# Patient Record
Sex: Female | Born: 1950 | Race: White | Hispanic: No | Marital: Married | Smoking: Never smoker
Health system: Southern US, Community
[De-identification: ages and names within clinical notes are randomized; demographics above are authoritative.]

## PROBLEM LIST (undated history)

## (undated) DIAGNOSIS — E785 Hyperlipidemia, unspecified: Secondary | ICD-10-CM

## (undated) DIAGNOSIS — E079 Disorder of thyroid, unspecified: Secondary | ICD-10-CM

## (undated) DIAGNOSIS — I10 Essential (primary) hypertension: Secondary | ICD-10-CM

## (undated) DIAGNOSIS — C50919 Malignant neoplasm of unspecified site of unspecified female breast: Secondary | ICD-10-CM

## (undated) HISTORY — DX: Disorder of thyroid, unspecified: E07.9

## (undated) HISTORY — DX: Essential (primary) hypertension: I10

## (undated) HISTORY — DX: Hyperlipidemia, unspecified: E78.5

## (undated) HISTORY — DX: Malignant neoplasm of unspecified site of unspecified female breast: C50.919

## (undated) HISTORY — PX: TONSILLECTOMY: SUR1361

## (undated) HISTORY — PX: BREAST LUMPECTOMY: SHX2

## (undated) HISTORY — PX: OTHER SURGICAL HISTORY: SHX169

---

## 2007-03-27 DIAGNOSIS — E039 Hypothyroidism, unspecified: Secondary | ICD-10-CM

## 2007-03-27 HISTORY — DX: Hypothyroidism, unspecified: E03.9

## 2016-05-03 DIAGNOSIS — W000XXA Fall on same level due to ice and snow, initial encounter: Secondary | ICD-10-CM | POA: Diagnosis not present

## 2016-05-03 DIAGNOSIS — S20221A Contusion of right back wall of thorax, initial encounter: Secondary | ICD-10-CM | POA: Diagnosis not present

## 2016-07-10 DIAGNOSIS — E782 Mixed hyperlipidemia: Secondary | ICD-10-CM | POA: Diagnosis not present

## 2016-07-10 DIAGNOSIS — Z7721 Contact with and (suspected) exposure to potentially hazardous body fluids: Secondary | ICD-10-CM | POA: Diagnosis not present

## 2016-07-10 DIAGNOSIS — E039 Hypothyroidism, unspecified: Secondary | ICD-10-CM | POA: Diagnosis not present

## 2016-07-17 DIAGNOSIS — E039 Hypothyroidism, unspecified: Secondary | ICD-10-CM | POA: Diagnosis not present

## 2016-07-17 DIAGNOSIS — E782 Mixed hyperlipidemia: Secondary | ICD-10-CM | POA: Diagnosis not present

## 2016-12-03 DIAGNOSIS — M170 Bilateral primary osteoarthritis of knee: Secondary | ICD-10-CM | POA: Diagnosis not present

## 2016-12-03 DIAGNOSIS — M25562 Pain in left knee: Secondary | ICD-10-CM | POA: Diagnosis not present

## 2017-02-11 DIAGNOSIS — M25562 Pain in left knee: Secondary | ICD-10-CM | POA: Diagnosis not present

## 2017-02-18 DIAGNOSIS — M25562 Pain in left knee: Secondary | ICD-10-CM | POA: Diagnosis not present

## 2017-02-22 DIAGNOSIS — M25562 Pain in left knee: Secondary | ICD-10-CM | POA: Diagnosis not present

## 2017-02-25 DIAGNOSIS — M25562 Pain in left knee: Secondary | ICD-10-CM | POA: Diagnosis not present

## 2017-02-28 DIAGNOSIS — M25562 Pain in left knee: Secondary | ICD-10-CM | POA: Diagnosis not present

## 2017-03-06 DIAGNOSIS — M25562 Pain in left knee: Secondary | ICD-10-CM | POA: Diagnosis not present

## 2017-03-08 DIAGNOSIS — M25562 Pain in left knee: Secondary | ICD-10-CM | POA: Diagnosis not present

## 2017-03-12 DIAGNOSIS — M25562 Pain in left knee: Secondary | ICD-10-CM | POA: Diagnosis not present

## 2017-04-08 DIAGNOSIS — M25562 Pain in left knee: Secondary | ICD-10-CM | POA: Diagnosis not present

## 2017-04-09 DIAGNOSIS — E782 Mixed hyperlipidemia: Secondary | ICD-10-CM | POA: Diagnosis not present

## 2017-04-09 DIAGNOSIS — E039 Hypothyroidism, unspecified: Secondary | ICD-10-CM | POA: Diagnosis not present

## 2017-04-16 DIAGNOSIS — M25562 Pain in left knee: Secondary | ICD-10-CM | POA: Diagnosis not present

## 2017-04-23 DIAGNOSIS — M25562 Pain in left knee: Secondary | ICD-10-CM | POA: Diagnosis not present

## 2017-04-30 DIAGNOSIS — M25562 Pain in left knee: Secondary | ICD-10-CM | POA: Diagnosis not present

## 2017-05-08 DIAGNOSIS — M25562 Pain in left knee: Secondary | ICD-10-CM | POA: Diagnosis not present

## 2017-05-14 DIAGNOSIS — M25562 Pain in left knee: Secondary | ICD-10-CM | POA: Diagnosis not present

## 2017-06-05 DIAGNOSIS — M25562 Pain in left knee: Secondary | ICD-10-CM | POA: Diagnosis not present

## 2017-07-02 DIAGNOSIS — M25562 Pain in left knee: Secondary | ICD-10-CM | POA: Diagnosis not present

## 2017-10-18 DIAGNOSIS — E039 Hypothyroidism, unspecified: Secondary | ICD-10-CM | POA: Diagnosis not present

## 2017-10-18 DIAGNOSIS — Z0000 Encounter for general adult medical examination without abnormal findings: Secondary | ICD-10-CM | POA: Diagnosis not present

## 2017-10-18 DIAGNOSIS — E782 Mixed hyperlipidemia: Secondary | ICD-10-CM | POA: Diagnosis not present

## 2018-04-22 DIAGNOSIS — E782 Mixed hyperlipidemia: Secondary | ICD-10-CM | POA: Diagnosis not present

## 2018-04-22 DIAGNOSIS — E039 Hypothyroidism, unspecified: Secondary | ICD-10-CM | POA: Diagnosis not present

## 2018-04-22 DIAGNOSIS — R05 Cough: Secondary | ICD-10-CM | POA: Diagnosis not present

## 2018-12-02 DIAGNOSIS — Z1231 Encounter for screening mammogram for malignant neoplasm of breast: Secondary | ICD-10-CM | POA: Diagnosis not present

## 2018-12-02 DIAGNOSIS — E039 Hypothyroidism, unspecified: Secondary | ICD-10-CM | POA: Diagnosis not present

## 2018-12-02 DIAGNOSIS — Z0000 Encounter for general adult medical examination without abnormal findings: Secondary | ICD-10-CM | POA: Diagnosis not present

## 2018-12-02 DIAGNOSIS — Z23 Encounter for immunization: Secondary | ICD-10-CM | POA: Diagnosis not present

## 2018-12-02 DIAGNOSIS — Z1211 Encounter for screening for malignant neoplasm of colon: Secondary | ICD-10-CM | POA: Diagnosis not present

## 2018-12-02 DIAGNOSIS — E2839 Other primary ovarian failure: Secondary | ICD-10-CM | POA: Diagnosis not present

## 2018-12-02 DIAGNOSIS — Z13820 Encounter for screening for osteoporosis: Secondary | ICD-10-CM | POA: Diagnosis not present

## 2018-12-02 DIAGNOSIS — E782 Mixed hyperlipidemia: Secondary | ICD-10-CM | POA: Diagnosis not present

## 2018-12-05 ENCOUNTER — Other Ambulatory Visit: Payer: Self-pay | Admitting: Family Medicine

## 2018-12-05 DIAGNOSIS — Z1231 Encounter for screening mammogram for malignant neoplasm of breast: Secondary | ICD-10-CM

## 2018-12-05 DIAGNOSIS — E2839 Other primary ovarian failure: Secondary | ICD-10-CM

## 2018-12-10 DIAGNOSIS — E039 Hypothyroidism, unspecified: Secondary | ICD-10-CM | POA: Diagnosis not present

## 2018-12-10 DIAGNOSIS — Z23 Encounter for immunization: Secondary | ICD-10-CM | POA: Diagnosis not present

## 2018-12-10 DIAGNOSIS — Z131 Encounter for screening for diabetes mellitus: Secondary | ICD-10-CM | POA: Diagnosis not present

## 2018-12-10 DIAGNOSIS — Z1211 Encounter for screening for malignant neoplasm of colon: Secondary | ICD-10-CM | POA: Diagnosis not present

## 2018-12-10 DIAGNOSIS — Z13820 Encounter for screening for osteoporosis: Secondary | ICD-10-CM | POA: Diagnosis not present

## 2018-12-10 DIAGNOSIS — Z0000 Encounter for general adult medical examination without abnormal findings: Secondary | ICD-10-CM | POA: Diagnosis not present

## 2018-12-10 DIAGNOSIS — Z1231 Encounter for screening mammogram for malignant neoplasm of breast: Secondary | ICD-10-CM | POA: Diagnosis not present

## 2018-12-10 DIAGNOSIS — E782 Mixed hyperlipidemia: Secondary | ICD-10-CM | POA: Diagnosis not present

## 2018-12-10 DIAGNOSIS — E2839 Other primary ovarian failure: Secondary | ICD-10-CM | POA: Diagnosis not present

## 2019-02-16 ENCOUNTER — Other Ambulatory Visit: Payer: Self-pay

## 2019-02-16 ENCOUNTER — Ambulatory Visit
Admission: RE | Admit: 2019-02-16 | Discharge: 2019-02-16 | Disposition: A | Discharge status: Home / Self Care | Payer: Medicare Other | Source: Ambulatory Visit | Attending: Family Medicine | Admitting: Family Medicine

## 2019-02-16 DIAGNOSIS — E2839 Other primary ovarian failure: Secondary | ICD-10-CM

## 2019-02-16 DIAGNOSIS — Z780 Asymptomatic menopausal state: Secondary | ICD-10-CM | POA: Diagnosis not present

## 2019-02-16 DIAGNOSIS — Z1231 Encounter for screening mammogram for malignant neoplasm of breast: Secondary | ICD-10-CM

## 2019-02-16 DIAGNOSIS — M85852 Other specified disorders of bone density and structure, left thigh: Secondary | ICD-10-CM | POA: Diagnosis not present

## 2019-02-16 DIAGNOSIS — M810 Age-related osteoporosis without current pathological fracture: Secondary | ICD-10-CM | POA: Diagnosis not present

## 2019-06-15 DIAGNOSIS — L820 Inflamed seborrheic keratosis: Secondary | ICD-10-CM | POA: Diagnosis not present

## 2019-12-08 DIAGNOSIS — E782 Mixed hyperlipidemia: Secondary | ICD-10-CM | POA: Diagnosis not present

## 2019-12-08 DIAGNOSIS — E039 Hypothyroidism, unspecified: Secondary | ICD-10-CM | POA: Diagnosis not present

## 2019-12-10 DIAGNOSIS — E782 Mixed hyperlipidemia: Secondary | ICD-10-CM | POA: Diagnosis not present

## 2019-12-10 DIAGNOSIS — E039 Hypothyroidism, unspecified: Secondary | ICD-10-CM | POA: Diagnosis not present

## 2020-02-15 DIAGNOSIS — Z1389 Encounter for screening for other disorder: Secondary | ICD-10-CM | POA: Diagnosis not present

## 2020-02-15 DIAGNOSIS — Z1211 Encounter for screening for malignant neoplasm of colon: Secondary | ICD-10-CM | POA: Diagnosis not present

## 2020-02-15 DIAGNOSIS — Z0000 Encounter for general adult medical examination without abnormal findings: Secondary | ICD-10-CM | POA: Diagnosis not present

## 2020-02-16 ENCOUNTER — Other Ambulatory Visit: Payer: Self-pay | Admitting: Family Medicine

## 2020-02-16 DIAGNOSIS — Z1211 Encounter for screening for malignant neoplasm of colon: Secondary | ICD-10-CM | POA: Diagnosis not present

## 2020-02-16 DIAGNOSIS — Z1231 Encounter for screening mammogram for malignant neoplasm of breast: Secondary | ICD-10-CM

## 2020-05-20 ENCOUNTER — Ambulatory Visit: Payer: Medicare Other

## 2020-05-30 DIAGNOSIS — E039 Hypothyroidism, unspecified: Secondary | ICD-10-CM | POA: Diagnosis not present

## 2020-05-30 DIAGNOSIS — E782 Mixed hyperlipidemia: Secondary | ICD-10-CM | POA: Diagnosis not present

## 2020-05-30 DIAGNOSIS — M79644 Pain in right finger(s): Secondary | ICD-10-CM | POA: Diagnosis not present

## 2020-07-25 ENCOUNTER — Other Ambulatory Visit: Payer: Self-pay

## 2020-07-25 ENCOUNTER — Ambulatory Visit
Admission: RE | Admit: 2020-07-25 | Discharge: 2020-07-25 | Disposition: A | Discharge status: Home / Self Care | Payer: Medicare Other | Source: Ambulatory Visit | Attending: Family Medicine | Admitting: Family Medicine

## 2020-07-25 DIAGNOSIS — Z1231 Encounter for screening mammogram for malignant neoplasm of breast: Secondary | ICD-10-CM | POA: Diagnosis not present

## 2020-07-26 ENCOUNTER — Other Ambulatory Visit: Payer: Self-pay | Admitting: Family Medicine

## 2020-07-26 DIAGNOSIS — R928 Other abnormal and inconclusive findings on diagnostic imaging of breast: Secondary | ICD-10-CM

## 2020-08-15 ENCOUNTER — Ambulatory Visit
Admission: RE | Admit: 2020-08-15 | Discharge: 2020-08-15 | Disposition: A | Discharge status: Home / Self Care | Payer: Medicare Other | Source: Ambulatory Visit | Attending: Family Medicine | Admitting: Family Medicine

## 2020-08-15 ENCOUNTER — Other Ambulatory Visit: Payer: Self-pay

## 2020-08-15 ENCOUNTER — Other Ambulatory Visit: Payer: Self-pay | Admitting: Family Medicine

## 2020-08-15 DIAGNOSIS — R922 Inconclusive mammogram: Secondary | ICD-10-CM | POA: Diagnosis not present

## 2020-08-15 DIAGNOSIS — R928 Other abnormal and inconclusive findings on diagnostic imaging of breast: Secondary | ICD-10-CM

## 2020-08-23 ENCOUNTER — Other Ambulatory Visit: Payer: Self-pay | Admitting: Family Medicine

## 2020-08-23 ENCOUNTER — Ambulatory Visit
Admission: RE | Admit: 2020-08-23 | Discharge: 2020-08-23 | Disposition: A | Discharge status: Home / Self Care | Payer: Medicare Other | Source: Ambulatory Visit | Attending: Family Medicine | Admitting: Family Medicine

## 2020-08-23 ENCOUNTER — Other Ambulatory Visit: Payer: Self-pay

## 2020-08-23 DIAGNOSIS — C50211 Malignant neoplasm of upper-inner quadrant of right female breast: Secondary | ICD-10-CM | POA: Diagnosis not present

## 2020-08-23 DIAGNOSIS — N6310 Unspecified lump in the right breast, unspecified quadrant: Secondary | ICD-10-CM

## 2020-08-23 DIAGNOSIS — R928 Other abnormal and inconclusive findings on diagnostic imaging of breast: Secondary | ICD-10-CM

## 2020-08-23 DIAGNOSIS — Z170 Estrogen receptor positive status [ER+]: Secondary | ICD-10-CM | POA: Diagnosis not present

## 2020-08-23 DIAGNOSIS — N6312 Unspecified lump in the right breast, upper inner quadrant: Secondary | ICD-10-CM | POA: Diagnosis not present

## 2020-08-25 ENCOUNTER — Encounter: Payer: Self-pay | Admitting: *Deleted

## 2020-08-25 ENCOUNTER — Telehealth: Payer: Self-pay | Admitting: Oncology

## 2020-08-25 DIAGNOSIS — C50211 Malignant neoplasm of upper-inner quadrant of right female breast: Secondary | ICD-10-CM | POA: Insufficient documentation

## 2020-08-25 DIAGNOSIS — Z170 Estrogen receptor positive status [ER+]: Secondary | ICD-10-CM

## 2020-08-25 NOTE — Telephone Encounter (Signed)
Spoke with the patient to confirm morning clinic appointment for 6/8, packet sent via email

## 2020-08-26 ENCOUNTER — Telehealth: Payer: Self-pay | Admitting: Oncology

## 2020-08-26 NOTE — Telephone Encounter (Signed)
Spoke to patient to follow up on her question, she advised she spoke with the breast center and she is good to go, she understands to come to the clinic and see what the team says as far as MRI

## 2020-08-29 NOTE — Progress Notes (Signed)
[place]   [phone] Fax:([phone]) [place] Note   Patient Care Team: Kristen Loader, FNP as PCP - General (Family Medicine) Rockwell Germany, RN as Oncology Nurse Navigator Mauro Kaufmann, RN as Oncology Nurse Navigator Stark Klein, MD as Consulting Physician (General Surgery) Truitt Merle, MD as Consulting Physician (Hematology) Eppie Gibson, MD as Attending Physician (Radiation Oncology)  Date of Service:  08/31/2020   CHIEF COMPLAINTS/PURPOSE OF CONSULTATION:  Newly Diagnosed Malignant neoplasm of upper-inner quadrant of right breast   Oncology History Overview Note  Cancer Staging Malignant neoplasm of upper-inner quadrant of right breast in female, estrogen receptor positive ([place]) Staging form: Breast, AJCC 8th Edition - Clinical stage from 08/23/2020: Stage IA (cT1b, cN0, cM0, G1, ER+, PR+, HER2-) - Signed by Truitt Merle, MD on 08/30/2020 Histologic grading system: 3 grade system    Malignant neoplasm of upper-inner quadrant of right breast in female, estrogen receptor positive ([place])  08/15/2020 Mammogram   an irregular hypoechoic mass in the RIGHT breast at the 2 o'clock axis, 6 cm from the nipple, measuring 8 x 5 mm, with internal vascularity, with associated architectural distortion, corresponding to the mammographic finding.   There is questionable intraductal extension peripheral to the mass, as manifested by mild ductal prominence up to 2 cm from the mass.   RIGHT axilla was evaluated with ultrasound showing no enlarged or morphologically abnormal lymph nodes   08/23/2020 Initial Biopsy   Diagnosis Breast, right, needle core biopsy, 2 o'clock - INVASIVE DUCTAL CARCINOMA - CALCIFICATIONS - SEE COMMENT  Microscopic Comment Based on the biopsy, the carcinoma appears Nottingham grade 1 of 3 and measures 0.4 cm in greatest linear extent. Prognostic markers (ER/PR/ki-67/HER2) are pending and will be reported in an  addendum. Dr. Saralyn Pilar reviewed the case and agrees with the above diagnosis. These results were called to [facility] on August 24, 2020.    08/23/2020 Receptors her2   PROGNOSTIC INDICATORS Results: IMMUNOHISTOCHEMICAL AND MORPHOMETRIC ANALYSIS PERFORMED MANUALLY The tumor cells are EQUIVOCAL for Her2 (2+). Her2 by FISH will be performed and results reported separately. Estrogen Receptor: 100%, POSITIVE, STRONG STAINING INTENSITY Progesterone Receptor: 90%, POSITIVE, STRONG STAINING INTENSITY Proliferation Marker Ki67: 1%   FLUORESCENCE IN-SITU HYBRIDIZATION Results: GROUP 5: HER2 **NEGATIVE** Equivocal form of amplification of the HER2 gene was detected in the IHC 2+ tissue sample received from this individual. HER2 FISH was performed by a technologist and cell imaging and analysis on the [employer].   08/23/2020 Cancer Staging   Staging form: Breast, AJCC 8th Edition - Clinical stage from 08/23/2020: Stage IA (cT1b, cN0, cM0, G1, ER+, PR+, HER2-) - Signed by Truitt Merle, MD on 08/30/2020 Histologic grading system: 3 grade system   08/25/2020 Initial Diagnosis   Malignant neoplasm of upper-inner quadrant of right breast in female, estrogen receptor positive ([place])      HISTORY OF PRESENTING ILLNESS:  Rachael Moore 70 y.o. female is a here because of newly diagnosed right breast cancer. The patient presents to the Breast clinic today accompanied by her husband.  Her mass was found on screening mammogram. She did not feel mass herself. This is her first abnormal mammogram. She denies any breast, nipple, weight or energy change. She has been purposefully losing weight. She denies significant baseline pain.   She has a PMHx of hypothyroidism and HTN. I reviewed her medication list with her. She had mouth surgery after fall from 25 ft. Her MGM had breast cancer.  Socially she is married with 2 adult children. She is retired from being a Materials engineer.  She plans to  proceed with travels in the next 2 weeks.    GYN HISTORY  Menarchal: 89 LMP: 65, age 54 Contraceptive: Birth control pills [phone] with 2 birth during  HRT: No G2P: first at age 89, breast fed    REVIEW OF SYSTEMS:    Constitutional: Denies fevers, chills or abnormal night sweats Eyes: Denies blurriness of vision, double vision or watery eyes Ears, nose, mouth, throat, and face: Denies mucositis or sore throat Respiratory: Denies cough, dyspnea or wheezes Cardiovascular: Denies palpitation, chest discomfort or lower extremity swelling Gastrointestinal:  Denies nausea, heartburn or change in bowel habits Skin: Denies abnormal skin rashes Lymphatics: Denies new lymphadenopathy or easy bruising Neurological:Denies numbness, tingling or new weaknesses Behavioral/Psych: Mood is stable, no new changes  All other systems were reviewed with the patient and are negative.   MEDICAL HISTORY:  Past Medical History:  Diagnosis Date  . Breast cancer ([place])   . Hyperlipidemia with target low density lipoprotein (LDL) cholesterol less than 100 mg/dL   . Hypertension   . Thyroid disease     SURGICAL HISTORY: Past Surgical History:  Procedure Laterality Date  . right knee surgery    . TONSILLECTOMY      SOCIAL HISTORY: Social History   Socioeconomic History  . Marital status: Married    Spouse name: Not on file  . Number of children: 2  . Years of education: Not on file  . Highest education level: Not on file  Occupational History  . Not on file  Tobacco Use  . Smoking status: Never Smoker  . Smokeless tobacco: Never Used  Substance and Sexual Activity  . Alcohol use: Never  . Drug use: Never  . Sexual activity: Not on file  Other Topics Concern  . Not on file  Social History Narrative  . Not on file   Social Determinants of Health   Financial Resource Strain: Not on file  Food Insecurity: Not on file  Transportation Needs: Not on file  Physical Activity: Not on  file  Stress: Not on file  Social Connections: Not on file  Intimate Partner Violence: Not on file    FAMILY HISTORY: Family History  Problem Relation Age of Onset  . Breast cancer Maternal Grandmother     ALLERGIES:  is allergic to other.  MEDICATIONS:  Current Outpatient Medications  Medication Sig Dispense Refill  . levothyroxine (SYNTHROID) 50 MCG tablet 1 tablet on an empty stomach in the morning    . Multiple Vitamins-Minerals (IMMUNE SUPPORT PO) Take 1 tablet by mouth daily at 6 (six) AM.    . Multiple Vitamins-Minerals (WOMENS MULTIVITAMIN PO) Take 1 tablet by mouth daily.    . pravastatin (PRAVACHOL) 40 MG tablet 1 tablet     No current facility-administered medications for this visit.    PHYSICAL EXAMINATION: ECOG PERFORMANCE STATUS: 0 - Asymptomatic  Vitals:   08/31/20 0908  BP: 135/68  Pulse: 64  Resp: 18  Temp: 97.9 F (36.6 C)  SpO2: 100%   Filed Weights   08/31/20 0908  Weight: 156 lb 3.2 oz (70.9 kg)    GENERAL:alert, no distress and comfortable SKIN: skin color, texture, turgor are normal, no rashes or significant lesions EYES: normal, Conjunctiva are pink and non-injected, sclera clear  NECK: supple, thyroid normal size, non-tender, without nodularity LYMPH:  no palpable lymphadenopathy in the cervical, axillary  LUNGS: clear to auscultation  and percussion with normal breathing effort HEART: regular rate & rhythm and no murmurs and no lower extremity edema ABDOMEN:abdomen soft, non-tender and normal bowel sounds Musculoskeletal:no cyanosis of digits and no clubbing  NEURO: alert & oriented x 3 with fluent speech, no focal motor/sensory deficits BREAST: (+) Skin ecchymosis at biopsy site of right breast. No palpable mass, nodules or adenopathy bilaterally. Breast exam benign.  LABORATORY DATA:  I have reviewed the data as listed CBC Latest Ref Rng & Units 08/31/2020  WBC 4.0 - 10.5 K/uL 5.7  Hemoglobin 12.0 - 15.0 g/dL 14.6  Hematocrit 36.0 -  46.0 % 42.6  Platelets 150 - 400 K/uL 287    CMP Latest Ref Rng & Units 08/31/2020  Glucose 70 - 99 mg/dL 95  BUN 8 - 23 mg/dL 13  Creatinine 0.44 - 1.00 mg/dL 0.89  Sodium 135 - 145 mmol/L 142  Potassium 3.5 - 5.1 mmol/L 4.6  Chloride 98 - 111 mmol/L 106  CO2 22 - 32 mmol/L 26  Calcium 8.9 - 10.3 mg/dL 10.0  Total Protein 6.5 - 8.1 g/dL 7.2  Total Bilirubin 0.3 - 1.2 mg/dL 0.8  Alkaline Phos 38 - 126 U/L 43  AST 15 - 41 U/L 27  ALT 0 - 44 U/L 8     RADIOGRAPHIC STUDIES: I have personally reviewed the radiological images as listed and agreed with the findings in the report. US BREAST LTD UNI RIGHT INC AXILLA  Result Date: 08/15/2020 CLINICAL DATA:  Patient returns today to evaluate a possible RIGHT breast asymmetry questioned on recent screening mammogram. EXAM: DIGITAL DIAGNOSTIC UNILATERAL RIGHT MAMMOGRAM WITH TOMOSYNTHESIS AND CAD; ULTRASOUND RIGHT BREAST LIMITED TECHNIQUE: Right digital diagnostic mammography and breast tomosynthesis was performed. The images were evaluated with computer-aided detection.; Targeted ultrasound examination of the right breast was performed COMPARISON:  Previous exams including recent screening mammogram dated 07/25/2020 ACR Breast Density Category b: There are scattered areas of fibroglandular density. FINDINGS: RIGHT breast diagnostic mammogram: On today's additional diagnostic views, including spot compression with 3D tomosynthesis, an irregular mass is confirmed within the inner RIGHT breast, 2-3 o'clock axis, measuring approximately 7 mm greatest dimension, with associated architectural distortion. Targeted ultrasound is performed, showing an irregular hypoechoic mass in the RIGHT breast at the 2 o'clock axis, 6 cm from the nipple, measuring 8 x 5 mm, with internal vascularity, with associated architectural distortion, corresponding to the mammographic finding. There is questionable intraductal extension peripheral to the mass, as manifested by mild  ductal prominence up to 2 cm from the mass. RIGHT axilla was evaluated with ultrasound showing no enlarged or morphologically abnormal lymph nodes. IMPRESSION: Highly suspicious mass within the RIGHT breast at the 2 o'clock axis, 6 cm from nipple, measuring 8 mm, corresponding to the mammographic finding. Ultrasound-guided biopsy is recommended. RECOMMENDATION: 1. Ultrasound-guided biopsy for the highly suspicious RIGHT breast mass at the 2 o'clock axis, 6 cm from the nipple, measuring 8 mm. 2. If malignancy is confirmed, recommend breast MRI to exclude adjacent intraductal extension and thereby definitively define extent. Ultrasound-guided biopsy will be scheduled at patient's convenience. I have discussed the findings and recommendations with the patient. If applicable, a reminder letter will be sent to the patient regarding the next appointment. BI-RADS CATEGORY  5: Highly suggestive of malignancy. Electronically Signed   By: Franki Cabot M.D.   On: 08/15/2020 10:48   MM DIAG BREAST TOMO UNI RIGHT  Result Date: 08/15/2020 CLINICAL DATA:  Patient returns today to evaluate a possible RIGHT breast asymmetry questioned  on recent screening mammogram. EXAM: DIGITAL DIAGNOSTIC UNILATERAL RIGHT MAMMOGRAM WITH TOMOSYNTHESIS AND CAD; ULTRASOUND RIGHT BREAST LIMITED TECHNIQUE: Right digital diagnostic mammography and breast tomosynthesis was performed. The images were evaluated with computer-aided detection.; Targeted ultrasound examination of the right breast was performed COMPARISON:  Previous exams including recent screening mammogram dated 07/25/2020 ACR Breast Density Category b: There are scattered areas of fibroglandular density. FINDINGS: RIGHT breast diagnostic mammogram: On today's additional diagnostic views, including spot compression with 3D tomosynthesis, an irregular mass is confirmed within the inner RIGHT breast, 2-3 o'clock axis, measuring approximately 7 mm greatest dimension, with associated  architectural distortion. Targeted ultrasound is performed, showing an irregular hypoechoic mass in the RIGHT breast at the 2 o'clock axis, 6 cm from the nipple, measuring 8 x 5 mm, with internal vascularity, with associated architectural distortion, corresponding to the mammographic finding. There is questionable intraductal extension peripheral to the mass, as manifested by mild ductal prominence up to 2 cm from the mass. RIGHT axilla was evaluated with ultrasound showing no enlarged or morphologically abnormal lymph nodes. IMPRESSION: Highly suspicious mass within the RIGHT breast at the 2 o'clock axis, 6 cm from nipple, measuring 8 mm, corresponding to the mammographic finding. Ultrasound-guided biopsy is recommended. RECOMMENDATION: 1. Ultrasound-guided biopsy for the highly suspicious RIGHT breast mass at the 2 o'clock axis, 6 cm from the nipple, measuring 8 mm. 2. If malignancy is confirmed, recommend breast MRI to exclude adjacent intraductal extension and thereby definitively define extent. Ultrasound-guided biopsy will be scheduled at patient's convenience. I have discussed the findings and recommendations with the patient. If applicable, a reminder letter will be sent to the patient regarding the next appointment. BI-RADS CATEGORY  5: Highly suggestive of malignancy. Electronically Signed   By: Franki Cabot M.D.   On: 08/15/2020 10:48   MM CLIP PLACEMENT RIGHT  Result Date: 08/23/2020 CLINICAL DATA:  Patient status post ultrasound-guided biopsy right breast mass. EXAM: DIAGNOSTIC RIGHT MAMMOGRAM POST ULTRASOUND BIOPSY COMPARISON:  Previous exam(s). FINDINGS: Mammographic images were obtained following ultrasound guided biopsy of right breast mass. The biopsy marking clip is in expected position at the site of biopsy. IMPRESSION: Appropriate positioning of the ribbon shaped biopsy marking clip at the site of biopsy in the right breast. Final Assessment: Post Procedure Mammograms for Marker Placement  Electronically Signed   By: Lovey Newcomer M.D.   On: 08/23/2020 08:54   [place] RT BREAST BX W LOC DEV 1ST LESION IMG BX SPEC US GUIDE  Addendum Date: 08/25/2020   ADDENDUM REPORT: 08/24/2020 13:56 ADDENDUM: Pathology revealed GRADE I INVASIVE DUCTAL CARCINOMA, CALCIFICATIONS of the Right breast, 2 o'clock. This was found to be concordant by Dr. Lovey Newcomer. Pathology results were discussed with the patient and her husband by telephone. The patient reported doing well after the biopsy with tenderness at the site. Post biopsy instructions and care were reviewed and questions were answered. The patient was encouraged to call [facility] for any additional concerns. My direct phone number was provided. The patient was referred to [facility] on August 31, 2020. Recommend breast MRI to exclude adjacent intraductal extension and thereby definitively define extent. Pathology results reported by Terie Purser, RN on 08/24/2020. Electronically Signed   By: Lovey Newcomer M.D.   On: 08/24/2020 13:56   Result Date: 08/25/2020 CLINICAL DATA:  Patient with indeterminate right breast mass 2 o'clock position. EXAM: ULTRASOUND GUIDED RIGHT BREAST CORE NEEDLE BIOPSY COMPARISON:  Previous exam(s). PROCEDURE: I met with the patient and we discussed the procedure of ultrasound-guided biopsy, including benefits and alternatives. We discussed the high likelihood of a successful procedure. We discussed the risks of the procedure, including infection, bleeding, tissue injury, clip migration, and inadequate sampling. Informed written consent was given. The usual time-out protocol was performed immediately prior to the procedure. Lesion quadrant: Upper inner quadrant Using sterile technique and 1% Lidocaine as local anesthetic, under direct ultrasound visualization, a 14 gauge spring-loaded device was used to perform biopsy of right breast mass 2  o'clock position using a lateral approach. At the conclusion of the procedure ribbon shaped tissue marker clip was deployed into the biopsy cavity. Follow up 2 view mammogram was performed and dictated separately. IMPRESSION: Ultrasound guided biopsy of right breast mass 2 o'clock position. No apparent complications. Electronically Signed: By: Lovey Newcomer M.D. On: 08/23/2020 09:10    ASSESSMENT & PLAN:  Ryane Konieczny is a 70 y.o. Caucasian female with a history of Hypothyroidism and HTN   1. Malignant neoplasm of upper-inner quadrant of right breast, Stage IA, c(T1bN0M0), ER+/PR+/HER2-, Grade I -We discussed her image findings and the biopsy results in great details. She was seen to have a 97m mass in her right breast cancer. Biopsy showed this to be invasive ductal carcinoma, grade I. -Given the early stage disease, she likely need a right lumpectomy. She is agreeable with that. She was seen by Dr. BBarry Dienestoday and likely will proceed with surgery soon. Due to her age and likely low risk disease, will not biopsy her SLN  -If surgical sample is larger than 1cm, I will recommend a Oncotype Dx test on the surgical sample and we'll make a decision about adjuvant chemotherapy based on the Oncotype result. She has good health overall and fit, would be a good candidate for chemotherapy if her Oncotype recurrence score is high. I suspect she has low risk disease based on her prognostic panel  -The risk of recurrence depends on the stage and biology of the tumor. She is early stage, with ER/PR positive and HER2 negative markers. I discussed this is the more common type of slow growing tumor.  -She was also seen by radiation oncologist Dr. SIsidore Moostoday. Adjuvant radiation is recommended after lumpectomy to reduce her risk of local recurrence.  She will think about proceeding with radiation.  -Given the strong ER and PR expression in her postmenopausal status, I recommend adjuvant endocrine therapy with  aromatase inhibitor for a total of 5-10 years to reduce the risk of cancer recurrence. Potential benefits and side effects were discussed with patient and she is interested. -We also discussed the breast cancer surveillance after her surgery. She will continue annual screening mammogram, self exam, and a routine office visit with lab and exam with [place] I discussed managing her overall health. I discussed diet with lean meat, more vegetables and fruits and remaining active and positive.  -Labs today show, CBC and CMP WNL. I discussed she does not have strong family history of cancer. If she wants to proceed with genetic testing she would have to pay out of pocket as insurance will not cover it. She is fine with not proceed with genetic testing.  -She will proceed with surgery soon. I will f/u with her after surgery or radiation.    PLAN:  -Proceed with surgery (right lumpectomy) soon  -Oncotype if tumor>1cm  -I will f/u with her after surgery or radiation    No orders of the  defined types were placed in this encounter.   All questions were answered. The patient knows to call the clinic with any problems, questions or concerns. The total time spent in the appointment was 50 minutes.     Truitt Merle, MD 08/31/2020 11:11 AM  I, Joslyn Devon, am acting as scribe for Truitt Merle, MD.   I have reviewed the above documentation for accuracy and completeness, and I agree with the above.

## 2020-08-30 ENCOUNTER — Other Ambulatory Visit: Payer: Self-pay | Admitting: General Surgery

## 2020-08-30 DIAGNOSIS — C50211 Malignant neoplasm of upper-inner quadrant of right female breast: Secondary | ICD-10-CM

## 2020-08-30 DIAGNOSIS — Z170 Estrogen receptor positive status [ER+]: Secondary | ICD-10-CM

## 2020-08-31 ENCOUNTER — Encounter: Payer: Self-pay | Admitting: Hematology

## 2020-08-31 ENCOUNTER — Other Ambulatory Visit: Payer: Self-pay | Admitting: General Surgery

## 2020-08-31 ENCOUNTER — Inpatient Hospital Stay (HOSPITAL_BASED_OUTPATIENT_CLINIC_OR_DEPARTMENT_OTHER): Payer: Medicare Other | Admitting: Hematology

## 2020-08-31 ENCOUNTER — Encounter: Payer: Self-pay | Admitting: *Deleted

## 2020-08-31 ENCOUNTER — Encounter: Payer: Self-pay | Admitting: Licensed Clinical Social Worker

## 2020-08-31 ENCOUNTER — Ambulatory Visit
Admission: RE | Admit: 2020-08-31 | Discharge: 2020-08-31 | Disposition: A | Discharge status: Home / Self Care | Payer: Medicare Other | Source: Ambulatory Visit | Attending: Radiation Oncology | Admitting: Radiation Oncology

## 2020-08-31 ENCOUNTER — Other Ambulatory Visit: Payer: Self-pay

## 2020-08-31 ENCOUNTER — Inpatient Hospital Stay: Payer: Medicare Other | Attending: Hematology

## 2020-08-31 VITALS — BP 135/68 | HR 64 | Temp 97.9°F | Resp 18 | Wt 156.2 lb

## 2020-08-31 DIAGNOSIS — E039 Hypothyroidism, unspecified: Secondary | ICD-10-CM | POA: Diagnosis not present

## 2020-08-31 DIAGNOSIS — Z803 Family history of malignant neoplasm of breast: Secondary | ICD-10-CM

## 2020-08-31 DIAGNOSIS — C50211 Malignant neoplasm of upper-inner quadrant of right female breast: Secondary | ICD-10-CM

## 2020-08-31 DIAGNOSIS — I10 Essential (primary) hypertension: Secondary | ICD-10-CM | POA: Diagnosis not present

## 2020-08-31 DIAGNOSIS — Z170 Estrogen receptor positive status [ER+]: Secondary | ICD-10-CM

## 2020-08-31 LAB — CBC WITH DIFFERENTIAL (CANCER CENTER ONLY)
Abs Immature Granulocytes: 0 10*3/uL (ref 0.00–0.07)
Basophils Absolute: 0.1 10*3/uL (ref 0.0–0.1)
Basophils Relative: 1 %
Eosinophils Absolute: 0.1 10*3/uL (ref 0.0–0.5)
Eosinophils Relative: 2 %
HCT: 42.6 % (ref 36.0–46.0)
Hemoglobin: 14.6 g/dL (ref 12.0–15.0)
Immature Granulocytes: 0 %
Lymphocytes Relative: 47 %
Lymphs Abs: 2.7 10*3/uL (ref 0.7–4.0)
MCH: 32.7 pg (ref 26.0–34.0)
MCHC: 34.3 g/dL (ref 30.0–36.0)
MCV: 95.3 fL (ref 80.0–100.0)
Monocytes Absolute: 0.6 10*3/uL (ref 0.1–1.0)
Monocytes Relative: 11 %
Neutro Abs: 2.2 10*3/uL (ref 1.7–7.7)
Neutrophils Relative %: 39 %
Platelet Count: 287 10*3/uL (ref 150–400)
RBC: 4.47 MIL/uL (ref 3.87–5.11)
RDW: 12.2 % (ref 11.5–15.5)
WBC Count: 5.7 10*3/uL (ref 4.0–10.5)
nRBC: 0 % (ref 0.0–0.2)

## 2020-08-31 LAB — CMP (CANCER CENTER ONLY)
ALT: 8 U/L (ref 0–44)
AST: 27 U/L (ref 15–41)
Albumin: 4.2 g/dL (ref 3.5–5.0)
Alkaline Phosphatase: 43 U/L (ref 38–126)
Anion gap: 10 (ref 5–15)
BUN: 13 mg/dL (ref 8–23)
CO2: 26 mmol/L (ref 22–32)
Calcium: 10 mg/dL (ref 8.9–10.3)
Chloride: 106 mmol/L (ref 98–111)
Creatinine: 0.89 mg/dL (ref 0.44–1.00)
GFR, Estimated: >60 mL/min (ref >60)
Glucose, Bld: 95 mg/dL (ref 70–99)
Potassium: 4.6 mmol/L (ref 3.5–5.1)
Sodium: 142 mmol/L (ref 135–145)
Total Bilirubin: 0.8 mg/dL (ref 0.3–1.2)
Total Protein: 7.2 g/dL (ref 6.5–8.1)

## 2020-08-31 LAB — GENETIC SCREENING ORDER

## 2020-08-31 NOTE — Progress Notes (Signed)
[place] Work  Initial Assessment   Rachael Moore is a 70 y.o. year old female accompanied by patient and husband, Rachael Moore. Clinical Social Work was referred by [facility] for assessment of psychosocial needs.   SDOH (Social Determinants of Health) assessments performed: Yes SDOH Interventions   Flowsheet Row Most Recent Value  SDOH Interventions   Food Insecurity Interventions Intervention Not Indicated  Financial Strain Interventions Other (Comment)  [possible financial strain depending on treatment cost]  Housing Interventions Intervention Not Indicated  Transportation Interventions Intervention Not Indicated      Distress Screen completed: Yes ONCBCN DISTRESS SCREENING 08/31/2020  Screening Type Initial Screening  Distress experienced in past week (1-10) 7  Practical problem type Insurance  Emotional problem type Nervousness/Anxiety;Adjusting to illness      Family/Social Information:  . Housing Arrangement: patient lives with husband, Rachael Moore. 3 adult children- 2 in [place], 1 in [place] . Family members/support persons in your life? Family, Friends and Church . Transportation concerns: no  . Employment: Agricultural engineer. Income source: Conservation officer, historic buildings . Financial concerns: Possibly depending on cost of treatment after insurance o Type of concern: Medical bills . Food access concerns: no . Religious or spiritual practice: yes . Services Currently in place:  n/a  Coping/ Adjustment to diagnosis: . Patient understands treatment plan and what happens next? yes, feeling better after hearing more about diagnosis and plan . Concerns about diagnosis and/or treatment: Cost of services . Patient reported stressors: Insurance and Adjusting to my illness . Current coping skills/ strengths: Capable of independent living, Motivation for treatment/growth, Religious Affiliation and Supportive family/friends    SUMMARY: Current SDOH Barriers:  . Potential financial concerns  related to cost of treatment  Clinical Social Work Clinical Goal(s):  Marland Kitchen Patient will review resources from breast cancer foundations provided today and contact social work if she decides to apply  Interventions: . Discussed common feeling and emotions when being diagnosed with cancer, and the importance of support during treatment . Informed patient of the support team roles and support services at [facility] . Provided CSW contact information and encouraged patient to call with any questions or concerns . Provided patient with information about breast cancer foundations for financial assistance   Follow Up Plan: Patient will contact CSW if she has questions or needs assistance applying to foundations Patient verbalizes understanding of plan: Yes    Christeen Douglas , LCSW

## 2020-08-31 NOTE — Progress Notes (Signed)
Radiation Oncology         [phone] ________________________________  Initial outpatient Consultation  Name: Rachael Moore MRN: [id number]  Date: 08/31/2020  DOB: 08/08/1950  ZC:HYIFO, Beryle Lathe, FNP  Stark Klein, MD   REFERRING PHYSICIAN: Stark Klein, MD  DIAGNOSIS:    ICD-10-CM   1. Malignant neoplasm of upper-inner quadrant of right breast in female, estrogen receptor positive ([place])  C50.211    Z17.0    Cancer Staging Malignant neoplasm of upper-inner quadrant of right breast in female, estrogen receptor positive ([place]) Staging form: Breast, AJCC 8th Edition - Clinical stage from 08/23/2020: Stage IA (cT1b, cN0, cM0, G1, ER+, PR+, HER2-) - Signed by Truitt Merle, MD on 08/30/2020 Histologic grading system: 3 grade system   CHIEF COMPLAINT: Here to discuss management of right breast cancer  HISTORY OF PRESENT ILLNESS::Rachael Moore is a 70 y.o. female who presented with breast abnormality on the following imaging: Screening mammogram showed right breast asymmetry.  Symptoms, if any, at that time, were: None.   Ultrasound of breast -right-  revealed an 8 mm mass at the 2 o'clock position with no suspicious lymph nodes in the right axilla.   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    She is here with her husband today.  She reports that her maternal grandmother was diagnosed age 29 with breast cancer.  Both her mother and her grandmother lived long lives, at least into their late 55s.  She is a Agricultural engineer.  She has 3 children, 2 living in [place] and one in [place].  She denies prior history of hormone replacement therapy.  She has underlying hypothyroidism.  PREVIOUS RADIATION THERAPY: No  PAST MEDICAL HISTORY:  has a past medical history of Breast cancer ([place]), Hyperlipidemia with target low density lipoprotein (LDL) cholesterol less than 100 mg/dL, Hypertension, and Thyroid disease.     PAST SURGICAL HISTORY: Past Surgical History:  Procedure Laterality Date  . right knee surgery    . TONSILLECTOMY      FAMILY HISTORY: family history includes Breast cancer in her maternal grandmother.  SOCIAL HISTORY:  reports that she has never smoked. She has never used smokeless tobacco. She reports that she does not drink alcohol and does not use drugs.  ALLERGIES: Other  MEDICATIONS:  Current Outpatient Medications  Medication Sig Dispense Refill  . levothyroxine (SYNTHROID) 50 MCG tablet 1 tablet on an empty stomach in the morning    . Multiple Vitamins-Minerals (IMMUNE SUPPORT PO) Take 1 tablet by mouth daily at 6 (six) AM.    . Multiple Vitamins-Minerals (WOMENS MULTIVITAMIN PO) Take 1 tablet by mouth daily.    . pravastatin (PRAVACHOL) 40 MG tablet 1 tablet     No current facility-administered medications for this encounter.    REVIEW OF SYSTEMS: A 10+ POINT REVIEW OF SYSTEMS WAS OBTAINED including neurology, dermatology, psychiatry, cardiac, respiratory, lymph, extremities, GI, GU, Musculoskeletal, constitutional, breasts, reproductive, HEENT.  All pertinent positives are noted in the HPI.  All others are negative.   PHYSICAL EXAM:  vitals were not taken for this visit.   General: Alert and oriented, in no acute distress HEENT: Head is normocephalic.    Heart: Regular in rate and rhythm with no murmurs, rubs, or gallops. Chest: Clear to auscultation bilaterally, with no rhonchi, wheezes, or rales. Musculoskeletal: Ambulatory and able to get up on the examination table without difficulty  neurologic:  No obvious focalities. Speech is  fluent.  Psychiatric: Judgment and insight are intact. Affect is appropriate. Breasts: Right breast is notable for bruising and firmness at the biopsy site. No other palpable masses appreciated in the breasts or axillae bilaterally.   ECOG = 0  0 - Asymptomatic (Fully active, able to carry on all predisease activities without  restriction)  1 - Symptomatic but completely ambulatory (Restricted in physically strenuous activity but ambulatory and able to carry out work of a light or sedentary nature. For example, light housework, office work)  2 - Symptomatic, <50% in bed during the day (Ambulatory and capable of all self care but unable to carry out any work activities. Up and about more than 50% of waking hours)  3 - Symptomatic, >50% in bed, but not bedbound (Capable of only limited self-care, confined to bed or chair 50% or more of waking hours)  4 - Bedbound (Completely disabled. Cannot carry on any self-care. Totally confined to bed or chair)  5 - Death   Eustace Pen MM, Creech RH, Tormey DC, et al. [phone]). "Toxicity and response criteria of the [facility] Group". [place] Oncol. 5 (6): 649-55   LABORATORY DATA:  Lab Results  Component Value Date   WBC 5.7 08/31/2020   HGB 14.6 08/31/2020   HCT 42.6 08/31/2020   MCV 95.3 08/31/2020   PLT 287 08/31/2020   CMP     Component Value Date/Time   NA 142 08/31/2020 0817   K 4.6 08/31/2020 0817   CL 106 08/31/2020 0817   CO2 26 08/31/2020 0817   GLUCOSE 95 08/31/2020 0817   BUN 13 08/31/2020 0817   CREATININE 0.89 08/31/2020 0817   CALCIUM 10.0 08/31/2020 0817   PROT 7.2 08/31/2020 0817   ALBUMIN 4.2 08/31/2020 0817   AST 27 08/31/2020 0817   ALT 8 08/31/2020 0817   ALKPHOS 43 08/31/2020 0817   BILITOT 0.8 08/31/2020 0817   GFRNONAA >60 08/31/2020 0817         RADIOGRAPHY: US BREAST LTD UNI RIGHT INC AXILLA  Result Date: 08/15/2020 CLINICAL DATA:  Patient returns today to evaluate a possible RIGHT breast asymmetry questioned on recent screening mammogram. EXAM: DIGITAL DIAGNOSTIC UNILATERAL RIGHT MAMMOGRAM WITH TOMOSYNTHESIS AND CAD; ULTRASOUND RIGHT BREAST LIMITED TECHNIQUE: Right digital diagnostic mammography and breast tomosynthesis was performed. The images were evaluated with computer-aided detection.; Targeted ultrasound  examination of the right breast was performed COMPARISON:  Previous exams including recent screening mammogram dated 07/25/2020 ACR Breast Density Category b: There are scattered areas of fibroglandular density. FINDINGS: RIGHT breast diagnostic mammogram: On today's additional diagnostic views, including spot compression with 3D tomosynthesis, an irregular mass is confirmed within the inner RIGHT breast, 2-3 o'clock axis, measuring approximately 7 mm greatest dimension, with associated architectural distortion. Targeted ultrasound is performed, showing an irregular hypoechoic mass in the RIGHT breast at the 2 o'clock axis, 6 cm from the nipple, measuring 8 x 5 mm, with internal vascularity, with associated architectural distortion, corresponding to the mammographic finding. There is questionable intraductal extension peripheral to the mass, as manifested by mild ductal prominence up to 2 cm from the mass. RIGHT axilla was evaluated with ultrasound showing no enlarged or morphologically abnormal lymph nodes. IMPRESSION: Highly suspicious mass within the RIGHT breast at the 2 o'clock axis, 6 cm from nipple, measuring 8 mm, corresponding to the mammographic finding. Ultrasound-guided biopsy is recommended. RECOMMENDATION: 1. Ultrasound-guided biopsy for the highly suspicious RIGHT breast mass at the 2 o'clock axis, 6 cm from the nipple, measuring  8 mm. 2. If malignancy is confirmed, recommend breast MRI to exclude adjacent intraductal extension and thereby definitively define extent. Ultrasound-guided biopsy will be scheduled at patient's convenience. I have discussed the findings and recommendations with the patient. If applicable, a reminder letter will be sent to the patient regarding the next appointment. BI-RADS CATEGORY  5: Highly suggestive of malignancy. Electronically Signed   By: Franki Cabot M.D.   On: 08/15/2020 10:48   MM DIAG BREAST TOMO UNI RIGHT  Result Date: 08/15/2020 CLINICAL DATA:  Patient  returns today to evaluate a possible RIGHT breast asymmetry questioned on recent screening mammogram. EXAM: DIGITAL DIAGNOSTIC UNILATERAL RIGHT MAMMOGRAM WITH TOMOSYNTHESIS AND CAD; ULTRASOUND RIGHT BREAST LIMITED TECHNIQUE: Right digital diagnostic mammography and breast tomosynthesis was performed. The images were evaluated with computer-aided detection.; Targeted ultrasound examination of the right breast was performed COMPARISON:  Previous exams including recent screening mammogram dated 07/25/2020 ACR Breast Density Category b: There are scattered areas of fibroglandular density. FINDINGS: RIGHT breast diagnostic mammogram: On today's additional diagnostic views, including spot compression with 3D tomosynthesis, an irregular mass is confirmed within the inner RIGHT breast, 2-3 o'clock axis, measuring approximately 7 mm greatest dimension, with associated architectural distortion. Targeted ultrasound is performed, showing an irregular hypoechoic mass in the RIGHT breast at the 2 o'clock axis, 6 cm from the nipple, measuring 8 x 5 mm, with internal vascularity, with associated architectural distortion, corresponding to the mammographic finding. There is questionable intraductal extension peripheral to the mass, as manifested by mild ductal prominence up to 2 cm from the mass. RIGHT axilla was evaluated with ultrasound showing no enlarged or morphologically abnormal lymph nodes. IMPRESSION: Highly suspicious mass within the RIGHT breast at the 2 o'clock axis, 6 cm from nipple, measuring 8 mm, corresponding to the mammographic finding. Ultrasound-guided biopsy is recommended. RECOMMENDATION: 1. Ultrasound-guided biopsy for the highly suspicious RIGHT breast mass at the 2 o'clock axis, 6 cm from the nipple, measuring 8 mm. 2. If malignancy is confirmed, recommend breast MRI to exclude adjacent intraductal extension and thereby definitively define extent. Ultrasound-guided biopsy will be scheduled at patient's  convenience. I have discussed the findings and recommendations with the patient. If applicable, a reminder letter will be sent to the patient regarding the next appointment. BI-RADS CATEGORY  5: Highly suggestive of malignancy. Electronically Signed   By: Franki Cabot M.D.   On: 08/15/2020 10:48   MM CLIP PLACEMENT RIGHT  Result Date: 08/23/2020 CLINICAL DATA:  Patient status post ultrasound-guided biopsy right breast mass. EXAM: DIAGNOSTIC RIGHT MAMMOGRAM POST ULTRASOUND BIOPSY COMPARISON:  Previous exam(s). FINDINGS: Mammographic images were obtained following ultrasound guided biopsy of right breast mass. The biopsy marking clip is in expected position at the site of biopsy. IMPRESSION: Appropriate positioning of the ribbon shaped biopsy marking clip at the site of biopsy in the right breast. Final Assessment: Post Procedure Mammograms for Marker Placement Electronically Signed   By: Lovey Newcomer M.D.   On: 08/23/2020 08:54   [place] RT BREAST BX W LOC DEV 1ST LESION IMG BX SPEC US GUIDE  Addendum Date: 08/25/2020   ADDENDUM REPORT: 08/24/2020 13:56 ADDENDUM: Pathology revealed GRADE I INVASIVE DUCTAL CARCINOMA, CALCIFICATIONS of the Right breast, 2 o'clock. This was found to be concordant by Dr. Lovey Newcomer. Pathology results were discussed with the patient and her husband by telephone. The patient reported doing well after the biopsy with tenderness at the site. Post biopsy instructions and care were reviewed and questions were answered. The patient was encouraged  to call [facility] for any additional concerns. My direct phone number was provided. The patient was referred to [facility] on August 31, 2020. Recommend breast MRI to exclude adjacent intraductal extension and thereby definitively define extent. Pathology results reported by Terie Purser, RN on 08/24/2020. Electronically Signed   By: Lovey Newcomer M.D.    On: 08/24/2020 13:56   Result Date: 08/25/2020 CLINICAL DATA:  Patient with indeterminate right breast mass 2 o'clock position. EXAM: ULTRASOUND GUIDED RIGHT BREAST CORE NEEDLE BIOPSY COMPARISON:  Previous exam(s). PROCEDURE: I met with the patient and we discussed the procedure of ultrasound-guided biopsy, including benefits and alternatives. We discussed the high likelihood of a successful procedure. We discussed the risks of the procedure, including infection, bleeding, tissue injury, clip migration, and inadequate sampling. Informed written consent was given. The usual time-out protocol was performed immediately prior to the procedure. Lesion quadrant: Upper inner quadrant Using sterile technique and 1% Lidocaine as local anesthetic, under direct ultrasound visualization, a 14 gauge spring-loaded device was used to perform biopsy of right breast mass 2 o'clock position using a lateral approach. At the conclusion of the procedure ribbon shaped tissue marker clip was deployed into the biopsy cavity. Follow up 2 view mammogram was performed and dictated separately. IMPRESSION: Ultrasound guided biopsy of right breast mass 2 o'clock position. No apparent complications. Electronically Signed: By: Lovey Newcomer M.D. On: 08/23/2020 09:10      IMPRESSION/PLAN: Stage I right breast cancer, ER positive  It was a pleasure meeting the patient today. We discussed the risks, benefits, and side effects of radiotherapy  She has been discussed at our multidisciplinary tumor board.  The consensus is that she is a good candidate for breast conservation.  She is enthusiastic about breast conservation.  For the patient's early stage favorable risk breast cancer, we had a thorough discussion about her options for adjuvant therapy. One option would be antiestrogen therapy as discussed with medical oncology. She would take a pill for approximately 5 years.  Another option is to take both the antiestrogen pill and adjuvant  radiation therapy.  Of note, I discussed the data from the [employer] al trial in the [employer]. She understands that tamoxifen compared to radiation plus tamoxifen demonstrated no survival benefit among the women in this study. The women were 33 years or older with stage I estrogen receptor positive breast cancer. Based on this study, I told the patient that her overall life expectancy should not be affected by adding radiotherapy to antiestrogen medication. She understands that the main benefit of  adding radiotherapy to anti estrogen therapy would be a very small but measurable local control benefit (risk of local recurrence to be lowered from ~9% --> ~2% over a decade).  We discussed the fact that radiotherapy only provides a local control benefit while anti-estrogen pills provide systemic coverage. That being said, the risk of systemic failure is relatively low with her type of breast cancer.  We discussed the risks benefits and side effects of radiotherapy. She understands that the side effects would likely include some skin irritation and fatigue during the weeks of radiation. There is a risk of late effects which include but are not necessarily limited to cosmetic changes and rare lung toxicity. I would anticipate delivering approximately 3-4 weeks of radiotherapy.  After a thorough discussion, the patient would like to think about her options.  I  asked our patient navigator to schedule a post-op followup with me so we can review her pathology and discuss her options further.The patient is enthusiastic about proceeding with treatment. I look forward to participating in the patient's care.  I will await her referral back to me for postoperative follow-up and eventual CT simulation/treatment planning.  We discussed vaccinations to reduce the risk of infection during the COVID-19 pandemic and I let her know that we do provide the [place] vaccines here at the cancer center.  She is  not interested in vaccination against [facility]-30 but she can certainly contact us if she changes her mind.  On date of service, in total, I spent 45 minutes on this encounter. Patient was seen in person.   __________________________________________   Eppie Gibson, MD

## 2020-09-01 ENCOUNTER — Encounter: Payer: Self-pay | Admitting: *Deleted

## 2020-09-01 ENCOUNTER — Telehealth: Payer: Self-pay | Admitting: *Deleted

## 2020-09-01 DIAGNOSIS — Z170 Estrogen receptor positive status [ER+]: Secondary | ICD-10-CM

## 2020-09-01 NOTE — Telephone Encounter (Signed)
Received msg from pt regarding SLN biopsy and genetic testing orders. Informed pt that sln bx or geentics testing will not be performed per Drs. Barry Dienes and [place]. Informed pt that appt with Dr. Isidore Moos will be made 4 wks after surgery.  Encourage pt to reach out with further questions or needs.

## 2020-09-08 ENCOUNTER — Encounter: Payer: Self-pay | Admitting: *Deleted

## 2020-09-09 ENCOUNTER — Other Ambulatory Visit: Payer: Self-pay | Admitting: General Surgery

## 2020-09-09 DIAGNOSIS — Z170 Estrogen receptor positive status [ER+]: Secondary | ICD-10-CM

## 2020-09-20 NOTE — Progress Notes (Signed)
Surgical Instructions    Your procedure is scheduled on Thursday July 7th.  Report to [facility] Main Entrance "A" at 5:30 A.M., then check in with the Admitting office.  Call this number if you have problems the morning of surgery:  [phone]   If you have any questions prior to your surgery date call [phone]: Open Monday-Friday 8am-4pm    Remember:  Do not eat after midnight the night before your surgery  You may drink clear liquids until 4:30am the morning of your surgery.   Clear liquids allowed are: Water, Non-Citrus Juices (without pulp), Carbonated Beverages, Clear Tea, Black Coffee Only, and Gatorade    Take these medicines the morning of surgery with A SIP OF WATER  levothyroxine (SYNTHROID) 50 MCG tablet acetaminophen (TYLENOL) 500 MG tablet if needed  As of today, STOP taking any Aspirin (unless otherwise instructed by your surgeon) Aleve, Naproxen, Ibuprofen, Motrin, Advil, Goody's, BC's, all herbal medications, fish oil, and all vitamins.          Do not wear jewelry or makeup Do not wear lotions, powders, perfumes, or deodorant. Do not shave 48 hours prior to surgery.   Do not bring valuables to the hospital. DO Not wear nail polish, gel polish, artificial nails, or any other type of covering on  natural nails including finger and toenails. If patients have artificial nails, gel coating, etc. that need to be removed by a nail salon please have this removed prior to surgery or surgery may need to be canceled/delayed if the surgeon/ anesthesia feels like the patient is unable to be adequately monitored.              [facility] is not responsible for any belongings or valuables.  Do NOT Smoke (Tobacco/Vaping) or drink Alcohol 24 hours prior to your procedure If you use a CPAP at night, you may bring all equipment for your overnight stay.   Contacts, glasses, dentures or bridgework may not be worn into surgery, please bring cases for these belongings   For  patients admitted to the hospital, discharge time will be determined by your treatment team.   Patients discharged the day of surgery will not be allowed to drive home, and someone needs to stay with them for 24 hours.  ONLY 1 SUPPORT PERSON MAY BE PRESENT WHILE YOU ARE IN SURGERY. IF YOU ARE TO BE ADMITTED ONCE YOU ARE IN YOUR ROOM YOU WILL BE ALLOWED TWO (2) VISITORS.  Minor children may have two parents present. Special consideration for safety and communication needs will be reviewed on a case by case basis.  Special instructions:    Oral Hygiene is also important to reduce your risk of infection.  Remember - BRUSH YOUR TEETH THE MORNING OF SURGERY WITH YOUR REGULAR TOOTHPASTE   [facility]- Preparing For Surgery  Before surgery, you can play an important role. Because skin is not sterile, your skin needs to be as free of germs as possible. You can reduce the number of germs on your skin by washing with CHG (chlorahexidine gluconate) Soap before surgery.  CHG is an antiseptic cleaner which kills germs and bonds with the skin to continue killing germs even after washing.     Please do not use if you have an allergy to CHG or antibacterial soaps. If your skin becomes reddened/irritated stop using the CHG.  Do not shave (including legs and underarms) for at least 48 hours prior to first CHG shower. It is OK to shave your  face.  Please follow these instructions carefully.     Shower the NIGHT BEFORE SURGERY and the MORNING OF SURGERY with CHG Soap.   If you chose to wash your hair, wash your hair first as usual with your normal shampoo. After you shampoo, rinse your hair and body thoroughly to remove the shampoo.  Then [employer] and genitals (private parts) with your normal soap and rinse thoroughly to remove soap.  After that Use CHG Soap as you would any other liquid soap. You can apply CHG directly to the skin and wash gently with a scrungie or a clean washcloth.   Apply the CHG Soap to  your body ONLY FROM THE NECK DOWN.  Do not use on open wounds or open sores. Avoid contact with your eyes, ears, mouth and genitals (private parts). Wash Face and genitals (private parts)  with your normal soap.   Wash thoroughly, paying special attention to the area where your surgery will be performed.  Thoroughly rinse your body with warm water from the neck down.  DO NOT shower/wash with your normal soap after using and rinsing off the CHG Soap.  Pat yourself dry with a CLEAN TOWEL.  Wear CLEAN PAJAMAS to bed the night before surgery  Place CLEAN SHEETS on your bed the night before your surgery  DO NOT SLEEP WITH PETS.   Day of Surgery:  Take a shower with CHG soap. Wear Clean/Comfortable clothing the morning of surgery Do not apply any deodorants/lotions.   Remember to brush your teeth WITH YOUR REGULAR TOOTHPASTE.   Please read over the following fact sheets that you were given.

## 2020-09-21 ENCOUNTER — Encounter (HOSPITAL_COMMUNITY): Payer: Self-pay

## 2020-09-21 ENCOUNTER — Other Ambulatory Visit: Payer: Self-pay

## 2020-09-21 ENCOUNTER — Encounter (HOSPITAL_COMMUNITY)
Admission: RE | Admit: 2020-09-21 | Discharge: 2020-09-21 | Disposition: A | Discharge status: Home / Self Care | Payer: Medicare Other | Source: Ambulatory Visit | Attending: General Surgery | Admitting: General Surgery

## 2020-09-21 DIAGNOSIS — Z01812 Encounter for preprocedural laboratory examination: Secondary | ICD-10-CM | POA: Diagnosis not present

## 2020-09-21 LAB — CBC
HCT: 44 % (ref 36.0–46.0)
Hemoglobin: 14.8 g/dL (ref 12.0–15.0)
MCH: 32.8 pg (ref 26.0–34.0)
MCHC: 33.6 g/dL (ref 30.0–36.0)
MCV: 97.6 fL (ref 80.0–100.0)
Platelets: 328 10*3/uL (ref 150–400)
RBC: 4.51 MIL/uL (ref 3.87–5.11)
RDW: 12.4 % (ref 11.5–15.5)
WBC: 7.4 10*3/uL (ref 4.0–10.5)
nRBC: 0 % (ref 0.0–0.2)

## 2020-09-21 LAB — BASIC METABOLIC PANEL
Anion gap: 5 (ref 5–15)
BUN: 8 mg/dL (ref 8–23)
CO2: 26 mmol/L (ref 22–32)
Calcium: 9.4 mg/dL (ref 8.9–10.3)
Chloride: 107 mmol/L (ref 98–111)
Creatinine, Ser: 0.75 mg/dL (ref 0.44–1.00)
GFR, Estimated: >60 mL/min (ref >60)
Glucose, Bld: 91 mg/dL (ref 70–99)
Potassium: 3.7 mmol/L (ref 3.5–5.1)
Sodium: 138 mmol/L (ref 135–145)

## 2020-09-21 NOTE — Progress Notes (Signed)
PCP - Jillyn Ledger, FNP Cardiologist - denies   Chest x-ray - denies EKG - denies Stress Test - denies ECHO - denies Cardiac Cath - denies  Sleep Study - denies  DM: denies   ERAS Protcol - yes  COVID TEST- Not indicated.   Anesthesia review: No  Patient denies shortness of breath, fever, cough and chest pain at PAT appointment   All instructions explained to the patient, with a verbal understanding of the material. Patient agrees to go over the instructions while at home for a better understanding. The opportunity to ask questions was provided.

## 2020-09-21 NOTE — Progress Notes (Signed)
Informed consent for surgery needed clarification. Pt had different understanding that there would be no sentinel lymph node biopsy. Ivey with [facility] was contacted. She verified there would be no sentinel lymph node biopsy. Awaiting new order.

## 2020-09-28 ENCOUNTER — Other Ambulatory Visit: Payer: Self-pay

## 2020-09-28 ENCOUNTER — Ambulatory Visit
Admission: RE | Admit: 2020-09-28 | Discharge: 2020-09-28 | Disposition: A | Discharge status: Home / Self Care | Payer: Medicare Other | Source: Ambulatory Visit | Attending: General Surgery | Admitting: General Surgery

## 2020-09-28 DIAGNOSIS — Z170 Estrogen receptor positive status [ER+]: Secondary | ICD-10-CM

## 2020-09-28 DIAGNOSIS — C50911 Malignant neoplasm of unspecified site of right female breast: Secondary | ICD-10-CM | POA: Diagnosis not present

## 2020-09-28 NOTE — Anesthesia Preprocedure Evaluation (Addendum)
Anesthesia Evaluation    Reviewed: Allergy & Precautions, Patient's Chart, lab work & pertinent test results  History of Anesthesia Complications Negative for: history of anesthetic complications  Airway Mallampati: II  TM Distance: >3 FB Neck ROM: Full    Dental no notable dental hx. (+) Dental Advisory Given   Pulmonary neg pulmonary ROS,    Pulmonary exam normal        Cardiovascular negative cardio ROS Normal cardiovascular exam     Neuro/Psych negative neurological ROS     GI/Hepatic negative GI ROS, Neg liver ROS,   Endo/Other  Hypothyroidism   Renal/GU negative Renal ROS     Musculoskeletal negative musculoskeletal ROS (+)   Abdominal   Peds  Hematology negative hematology ROS (+)   Anesthesia Other Findings   Reproductive/Obstetrics                            Anesthesia Physical Anesthesia Plan  ASA: 2  Anesthesia Plan: General   Post-op Pain Management:    Induction: Intravenous  PONV Risk Score and Plan: 4 or greater and Ondansetron, Dexamethasone and Midazolam  Airway Management Planned: LMA  Additional Equipment:   Intra-op Plan:   Post-operative Plan: Extubation in OR  Informed Consent: I have reviewed the patients History and Physical, chart, labs and discussed the procedure including the risks, benefits and alternatives for the proposed anesthesia with the patient or authorized representative who has indicated his/her understanding and acceptance.     Dental advisory given  Plan Discussed with: Anesthesiologist and CRNA  Anesthesia Plan Comments:        Anesthesia Quick Evaluation

## 2020-09-28 NOTE — H&P (Signed)
Rachael Moore Appointment: 08/31/2020 9:00 AM Location: [facility] Patient #: [id number] DOB: 08-Mar-1951 Undefined / Language: Rachael Moore / Race: White Female   History of Present Illness Rachael Klein MD; 08/31/2020 11:18 AM) The patient is a 70 year old female who presents with breast cancer.Pt is a 70 yo F who presented with screening detected right breast cancer 08/2020.  She was found to have asymmetry detected in the right breast on mammogram.  Diagnostic imaging showed an 8 mm mass at 2 o'clock on the right.  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    She had menarche at age 1.  menopause was age 58.  She is a G2P2 with first child age 50.  She used OCPs for around 5 years.  She is up to date with bone density and had colonoscopy in 2010 wtih subsequent FOBT.  She has no prior personal cancer history before this diagnosis.  She had a maternal grandmother with breast cancer at age 27.    Images and pathology were reviewed in multidisciplinary fashion today.  These are from [facility]  dx mammogram/us 08/15/20 TECHNIQUE: Right digital diagnostic mammography and breast tomosynthesis was performed. The images were evaluated with computer-aided detection.; Targeted ultrasound examination of the right breast was performed  COMPARISON:  Previous exams including recent screening mammogram dated 07/25/2020  ACR Breast Density Category b: There are scattered areas of fibroglandular density.  FINDINGS: RIGHT breast diagnostic mammogram: On today's additional diagnostic views, including spot compression with 3D tomosynthesis, an irregular mass is confirmed within the inner RIGHT breast, 2-3 o'clock axis, measuring approximately 7 mm greatest dimension, with associated architectural distortion.  Targeted ultrasound is performed, showing an irregular hypoechoic mass in the RIGHT breast at the 2 o'clock axis, 6 cm from  the nipple, measuring 8 x 5 mm, with internal vascularity, with associated architectural distortion, corresponding to the mammographic finding.  There is questionable intraductal extension peripheral to the mass, as manifested by mild ductal prominence up to 2 cm from the mass.  RIGHT axilla was evaluated with ultrasound showing no enlarged or morphologically abnormal lymph nodes.  IMPRESSION: Highly suspicious mass within the RIGHT breast at the 2 o'clock axis, 6 cm from nipple, measuring 8 mm, corresponding to the mammographic finding. Ultrasound-guided biopsy is recommended.  RECOMMENDATION: 1. Ultrasound-guided biopsy for the highly suspicious RIGHT breast mass at the 2 o'clock axis, 6 cm from the nipple, measuring 8 mm. 2. If malignancy is confirmed, recommend breast MRI to exclude adjacent intraductal extension and thereby definitively define extent.  Ultrasound-guided biopsy will be scheduled at patient's convenience.  I have discussed the findings and recommendations with the patient. If applicable, a reminder letter will be sent to the patient regarding the next appointment.  BI-RADS CATEGORY  5: Highly suggestive of malignancy.  pathology core needle biopsy 08/23/20 Breast, right, needle core biopsy, 2 o'clock - INVASIVE DUCTAL CARCINOMA - CALCIFICATIONS - SEE COMMENT Microscopic Comment Based on the biopsy, the carcinoma appears Nottingham grade 1 of 3 Estrogen Receptor: 100%, POSITIVE, STRONG STAINING INTENSITY Progesterone Receptor: 90%, POSITIVE, STRONG STAINING INTENSITY Proliferation Marker Ki67: 1% FLUORESCENCE IN-SITU HYBRIDIZATION Results: GROUP 5: HER2 **NEGATIVE**  CBC and CMET normal 08/31/20   Past Surgical History Rachael Klein, MD; 08/31/2020 8:15 AM) Breast Biopsy   Right. Oral Surgery   Tonsillectomy    Diagnostic Studies History Rachael Klein, MD; 08/31/2020 8:15 AM) Colonoscopy   >10 years ago Mammogram  within last year Pap Smear   >5  years ago  Social History Rachael Klein, MD; 08/31/2020 8:15 AM) Caffeine use   Tea. No alcohol use   No drug use   Tobacco use   Never smoker.  Family History Rachael Klein, MD; 08/31/2020 8:15 AM) Breast Cancer   Family Members In General.  Pregnancy / Birth History Rachael Klein, MD; 08/31/2020 8:15 AM) Age at menarche   25 years. Age of menopause   <45 Gravida   2 Length (months) of breastfeeding   3-6 Maternal age   71-25 Para   2 Regular periods    Other Problems Rachael Klein, MD; 08/31/2020 8:15 AM) Hypercholesterolemia   Thyroid Disease      Review of Systems Rachael Klein MD; 08/31/2020 8:15 AM) General Not Present- Appetite Loss, Chills, Fatigue, Fever, Night Sweats, Weight Gain and Weight Loss. Skin Not Present- Change in Wart/Mole, Dryness, Hives, Jaundice, New Lesions, Non-Healing Wounds, Rash and Ulcer. HEENT Present- Wears glasses/contact lenses. Not Present- Earache, Hearing Loss, Hoarseness, Nose Bleed, Oral Ulcers, Ringing in the Ears, Seasonal Allergies, Sinus Pain, Sore Throat, Visual Disturbances and Yellow Eyes. Respiratory Not Present- Bloody sputum, Chronic Cough, Difficulty Breathing, Snoring and Wheezing. Breast Not Present- Breast Mass, Breast Pain, Nipple Discharge and Skin Changes. Cardiovascular Not Present- Chest Pain, Difficulty Breathing Lying Down, Leg Cramps, Palpitations, Rapid Heart Rate, Shortness of Breath and Swelling of Extremities. Gastrointestinal Not Present- Abdominal Pain, Bloating, Bloody Stool, Change in Bowel Habits, Chronic diarrhea, Constipation, Difficulty Swallowing, Excessive gas, Gets full quickly at meals, Hemorrhoids, Indigestion, Nausea, Rectal Pain and Vomiting. Female Genitourinary Not Present- Frequency, Nocturia, Painful Urination, Pelvic Pain and Urgency. Musculoskeletal Not Present- Back Pain, Joint Pain, Joint Stiffness, Muscle Pain, Muscle Weakness and Swelling of Extremities. Neurological Not Present- Decreased Memory,  Fainting, Headaches, Numbness, Seizures, Tingling, Tremor, Trouble walking and Weakness. Psychiatric Not Present- Anxiety, Bipolar, Change in Sleep Pattern, Depression, Fearful and Frequent crying. Endocrine Not Present- Cold Intolerance, Excessive Hunger, Hair Changes, Heat Intolerance, Hot flashes and New Diabetes. Hematology Not Present- Blood Thinners, Easy Bruising, Excessive bleeding, Gland problems, HIV and Persistent Infections.  Vitals Rachael Klein MD; 08/31/2020 11:13 AM) 08/31/2020 11:10 AM Weight: 156.2 lb   Temp.: 97.9 F    Pulse: 64 (Regular)    Resp.: 18 (Unlabored)    P.OX: 100% (Room air) BP: 135/68(Sitting, Left Arm, Standard)       Physical Exam Rachael Klein MD; 08/31/2020 11:14 AM) General Mental Status - Alert. General Appearance - Consistent with stated age. Hydration - Well hydrated. Voice - Normal.  Head and Neck Head - normocephalic, atraumatic with no lesions or palpable masses. Trachea - midline. Thyroid Gland Characteristics - normal size and consistency.  Eye Eyeball - Bilateral - Extraocular movements intact. Sclera/Conjunctiva - Bilateral - No scleral icterus.  Chest and Lung Exam Chest and lung exam reveals  - quiet, even and easy respiratory effort with no use of accessory muscles and on auscultation, normal breath sounds, no adventitious sounds and normal vocal resonance. Inspection Chest Wall - Normal. Back - normal.  Breast Note:  breasts relatively symmetric.  No palpable masses.  no skin dimpling.  No LAD.  No nipple retraction.  some tenderness right upper inner breast. No LAD   Cardiovascular Cardiovascular examination reveals  - normal heart sounds, regular rate and rhythm with no murmurs and normal pedal pulses bilaterally.  Abdomen Inspection Inspection of the abdomen reveals - No Hernias. Palpation/Percussion Palpation and Percussion of the abdomen reveal - Soft, Non  Tender, No Rebound tenderness, No Rigidity (guarding) and No  hepatosplenomegaly. Auscultation Auscultation of the abdomen reveals - Bowel sounds normal.  Neurologic Neurologic evaluation reveals  - alert and oriented x 3 with no impairment of recent or remote memory. Mental Status - Normal.  Musculoskeletal Global Assessment  - Note:  no gross deformities.  Normal Exam - Left - Upper Extremity Strength Normal and Lower Extremity Strength Normal. Normal Exam - Right - Upper Extremity Strength Normal and Lower Extremity Strength Normal.  Lymphatic Head & Neck  General Head & Neck Lymphatics: Bilateral - Description - Normal. Axillary  General Axillary Region: Bilateral - Description - Normal. Tenderness - Non Tender. Femoral & Inguinal  Generalized Femoral & Inguinal Lymphatics: Bilateral - Description - No Generalized lymphadenopathy.    Assessment & Plan Rachael Klein MD; 08/31/2020 11:17 AM) MALIGNANT NEOPLASM OF UPPER-INNER QUADRANT OF RIGHT BREAST IN FEMALE, ESTROGEN RECEPTOR POSITIVE (C50.211) Impression: Pt has a new diagnosis of cT1bN0 right breast cancer.  She appears to be a good candidate for seed localized lumpectomy. She does not require a LN biopsy due to the good prognostic profile and her age.  The surgical procedure was described to the patient. I discussed the incision type and location and that we would need radiology involved on with a seed marker in the breast the day prior to surgery.  The risks and benefits of the procedure were described to the patient and she wishes to proceed.  We discussed the risks bleeding, infection, damage to other structures, need for further procedures/surgeries. We discussed the risk of seroma. The patient was advised that we may need to go back to surgery for additional tissue to obtain negative margins or for a lymph node biopsy. The patient was advised that these are the most common complications, but that others can occur as well. They were advised against taking aspirin or other  anti-inflammatory agents/blood thinners the week before surgery. We also discussed post op recovery and restrictions.  She is going to [place] for 2 weeks and will be back June 29. She is going to [place] in the fall and would like to get her treatment between then if possible. Current Plans You are being scheduled for surgery - Our schedulers will call you.   You should hear from our office's scheduling department within 5 working days about the location, date, and time of surgery.  We try to make accommodations for patient's preferences in scheduling surgery, but sometimes the OR schedule or the surgeon's schedule prevents [place] from making those accommodations.  If you have not heard from our office [phone]) in 5 working days, call the office and ask for your surgeon's nurse.  If you have other questions about your diagnosis, plan, or surgery, call the office and ask for your surgeon's nurse.  Pt Education - flb breast cancer surgery: discussed with patient and provided information.

## 2020-09-29 ENCOUNTER — Ambulatory Visit (HOSPITAL_COMMUNITY): Payer: Medicare Other

## 2020-09-29 ENCOUNTER — Ambulatory Visit (HOSPITAL_COMMUNITY): Payer: Medicare Other | Admitting: Anesthesiology

## 2020-09-29 ENCOUNTER — Other Ambulatory Visit: Payer: Self-pay

## 2020-09-29 ENCOUNTER — Ambulatory Visit (HOSPITAL_COMMUNITY)
Admission: RE | Admit: 2020-09-29 | Discharge: 2020-09-29 | Disposition: A | Discharge status: Home / Self Care | Payer: Medicare Other | Source: Ambulatory Visit | Attending: General Surgery | Admitting: General Surgery

## 2020-09-29 ENCOUNTER — Encounter (HOSPITAL_COMMUNITY)
Admission: RE | Disposition: A | Discharge status: Home / Self Care | Payer: Self-pay | Source: Ambulatory Visit | Attending: General Surgery

## 2020-09-29 ENCOUNTER — Ambulatory Visit
Admission: RE | Admit: 2020-09-29 | Discharge: 2020-09-29 | Disposition: A | Discharge status: Home / Self Care | Payer: Medicare Other | Source: Ambulatory Visit | Attending: General Surgery | Admitting: General Surgery

## 2020-09-29 ENCOUNTER — Encounter (HOSPITAL_COMMUNITY): Payer: Self-pay | Admitting: General Surgery

## 2020-09-29 DIAGNOSIS — C50211 Malignant neoplasm of upper-inner quadrant of right female breast: Secondary | ICD-10-CM | POA: Diagnosis not present

## 2020-09-29 DIAGNOSIS — Z170 Estrogen receptor positive status [ER+]: Secondary | ICD-10-CM | POA: Diagnosis not present

## 2020-09-29 DIAGNOSIS — R928 Other abnormal and inconclusive findings on diagnostic imaging of breast: Secondary | ICD-10-CM | POA: Diagnosis not present

## 2020-09-29 DIAGNOSIS — N6011 Diffuse cystic mastopathy of right breast: Secondary | ICD-10-CM | POA: Diagnosis not present

## 2020-09-29 DIAGNOSIS — I10 Essential (primary) hypertension: Secondary | ICD-10-CM | POA: Diagnosis not present

## 2020-09-29 DIAGNOSIS — N6081 Other benign mammary dysplasias of right breast: Secondary | ICD-10-CM | POA: Insufficient documentation

## 2020-09-29 DIAGNOSIS — E039 Hypothyroidism, unspecified: Secondary | ICD-10-CM | POA: Diagnosis not present

## 2020-09-29 DIAGNOSIS — C50911 Malignant neoplasm of unspecified site of right female breast: Secondary | ICD-10-CM | POA: Diagnosis not present

## 2020-09-29 HISTORY — PX: BREAST LUMPECTOMY WITH RADIOACTIVE SEED LOCALIZATION: SHX6424

## 2020-09-29 SURGERY — BREAST LUMPECTOMY WITH RADIOACTIVE SEED LOCALIZATION
Anesthesia: General | Site: Breast | Laterality: Right

## 2020-09-29 MED ORDER — MIDAZOLAM HCL 2 MG/2ML IJ SOLN
INTRAMUSCULAR | Status: DC | PRN
  Administered 2020-09-29: 2 mg via INTRAVENOUS

## 2020-09-29 MED ORDER — MIDAZOLAM HCL 2 MG/2ML IJ SOLN
INTRAMUSCULAR | Status: AC
  Filled 2020-09-29: qty 2

## 2020-09-29 MED ORDER — ONDANSETRON HCL 4 MG/2ML IJ SOLN
INTRAMUSCULAR | Status: AC
  Filled 2020-09-29: qty 2

## 2020-09-29 MED ORDER — LIDOCAINE HCL 1 % IJ SOLN
INTRAMUSCULAR | Status: AC
  Filled 2020-09-29: qty 20

## 2020-09-29 MED ORDER — PHENYLEPHRINE HCL-NACL 10-0.9 MG/250ML-% IV SOLN
INTRAVENOUS | Status: DC | PRN
  Administered 2020-09-29: 25 ug/min via INTRAVENOUS

## 2020-09-29 MED ORDER — CHLORHEXIDINE GLUCONATE 0.12 % MT SOLN
OROMUCOSAL | Status: AC
  Administered 2020-09-29: 15 mL
  Filled 2020-09-29: qty 15

## 2020-09-29 MED ORDER — ACETAMINOPHEN 500 MG PO TABS
1000.0000 mg | ORAL_TABLET | ORAL | Status: AC
  Administered 2020-09-29: 1000 mg via ORAL
  Filled 2020-09-29: qty 2

## 2020-09-29 MED ORDER — LIDOCAINE 2% (20 MG/ML) 5 ML SYRINGE
INTRAMUSCULAR | Status: AC
  Filled 2020-09-29: qty 5

## 2020-09-29 MED ORDER — FENTANYL CITRATE (PF) 250 MCG/5ML IJ SOLN
INTRAMUSCULAR | Status: DC | PRN
  Administered 2020-09-29: 25 ug via INTRAVENOUS
  Administered 2020-09-29: 100 ug via INTRAVENOUS

## 2020-09-29 MED ORDER — BUPIVACAINE-EPINEPHRINE (PF) 0.25% -1:200000 IJ SOLN
INTRAMUSCULAR | Status: AC
  Filled 2020-09-29: qty 30

## 2020-09-29 MED ORDER — ONDANSETRON HCL 4 MG/2ML IJ SOLN
INTRAMUSCULAR | Status: DC | PRN
  Administered 2020-09-29: 4 mg via INTRAVENOUS

## 2020-09-29 MED ORDER — FENTANYL CITRATE (PF) 250 MCG/5ML IJ SOLN
INTRAMUSCULAR | Status: AC
  Filled 2020-09-29: qty 5

## 2020-09-29 MED ORDER — 0.9 % SODIUM CHLORIDE (POUR BTL) OPTIME
TOPICAL | Status: DC | PRN
  Administered 2020-09-29: 1000 mL

## 2020-09-29 MED ORDER — LIDOCAINE 2% (20 MG/ML) 5 ML SYRINGE
INTRAMUSCULAR | Status: DC | PRN
  Administered 2020-09-29: 100 mg via INTRAVENOUS

## 2020-09-29 MED ORDER — CEFAZOLIN SODIUM-DEXTROSE 2-4 GM/100ML-% IV SOLN
2.0000 g | INTRAVENOUS | Status: AC
  Administered 2020-09-29: 2 g via INTRAVENOUS
  Filled 2020-09-29: qty 100

## 2020-09-29 MED ORDER — CHLORHEXIDINE GLUCONATE CLOTH 2 % EX PADS: 6.0000 | MEDICATED_PAD | Freq: Once | CUTANEOUS | Status: DC

## 2020-09-29 MED ORDER — OXYCODONE HCL 5 MG PO TABS: 5.0000 mg | ORAL_TABLET | Freq: Four times a day (QID) | ORAL | 0 refills | Status: DC | PRN

## 2020-09-29 MED ORDER — PROPOFOL 10 MG/ML IV BOLUS
INTRAVENOUS | Status: AC
  Filled 2020-09-29: qty 20

## 2020-09-29 MED ORDER — EPHEDRINE 5 MG/ML INJ
INTRAVENOUS | Status: AC
  Filled 2020-09-29: qty 10

## 2020-09-29 MED ORDER — EPHEDRINE SULFATE-NACL 50-0.9 MG/10ML-% IV SOSY
PREFILLED_SYRINGE | INTRAVENOUS | Status: DC | PRN
  Administered 2020-09-29 (×3): 10 mg via INTRAVENOUS

## 2020-09-29 MED ORDER — LACTATED RINGERS IV SOLN: INTRAVENOUS | Status: DC

## 2020-09-29 MED ORDER — LIDOCAINE HCL 1 % IJ SOLN
INTRAMUSCULAR | Status: DC | PRN
  Administered 2020-09-29: 30 mL via INTRAMUSCULAR

## 2020-09-29 MED ORDER — DEXAMETHASONE SODIUM PHOSPHATE 10 MG/ML IJ SOLN
INTRAMUSCULAR | Status: DC | PRN
  Administered 2020-09-29: 4 mg via INTRAVENOUS

## 2020-09-29 MED ORDER — DEXAMETHASONE SODIUM PHOSPHATE 10 MG/ML IJ SOLN
INTRAMUSCULAR | Status: AC
  Filled 2020-09-29: qty 1

## 2020-09-29 MED ORDER — PROPOFOL 10 MG/ML IV BOLUS
INTRAVENOUS | Status: DC | PRN
  Administered 2020-09-29: 120 mg via INTRAVENOUS
  Administered 2020-09-29: 30 mg via INTRAVENOUS
  Administered 2020-09-29: 50 mg via INTRAVENOUS

## 2020-09-29 SURGICAL SUPPLY — 41 items
BAG COUNTER SPONGE SURGICOUNT (BAG) ×2 IMPLANT
BINDER BREAST LRG (GAUZE/BANDAGES/DRESSINGS) IMPLANT
BINDER BREAST XLRG (GAUZE/BANDAGES/DRESSINGS) ×2 IMPLANT
BLADE SURG 10 STRL SS (BLADE) IMPLANT
CANISTER SUCT 3000ML PPV (MISCELLANEOUS) ×2 IMPLANT
CHLORAPREP W/TINT 26 (MISCELLANEOUS) ×2 IMPLANT
CLIP VESOCCLUDE LG 6/CT (CLIP) ×2 IMPLANT
CLIP VESOCCLUDE MED 6/CT (CLIP) ×2 IMPLANT
COVER PROBE W GEL 5X96 (DRAPES) ×2 IMPLANT
COVER SURGICAL LIGHT HANDLE (MISCELLANEOUS) ×2 IMPLANT
DERMABOND ADVANCED (GAUZE/BANDAGES/DRESSINGS) ×1
DERMABOND ADVANCED .7 DNX12 (GAUZE/BANDAGES/DRESSINGS) ×1 IMPLANT
DEVICE DUBIN SPECIMEN MAMMOGRA (MISCELLANEOUS) ×2 IMPLANT
DRAPE CHEST BREAST 15X10 FENES (DRAPES) ×2 IMPLANT
DRSG PAD ABDOMINAL 8X10 ST (GAUZE/BANDAGES/DRESSINGS) ×2 IMPLANT
ELECT COATED BLADE 2.86 ST (ELECTRODE) ×2 IMPLANT
ELECT REM PT RETURN 9FT ADLT (ELECTROSURGICAL) ×2
ELECTRODE REM PT RTRN 9FT ADLT (ELECTROSURGICAL) ×1 IMPLANT
GAUZE SPONGE 4X4 12PLY STRL (GAUZE/BANDAGES/DRESSINGS) ×2 IMPLANT
GAUZE SPONGE 4X4 12PLY STRL LF (GAUZE/BANDAGES/DRESSINGS) ×2 IMPLANT
GLOVE SURG ENC MOIS LTX SZ6 (GLOVE) ×2 IMPLANT
GLOVE SURG UNDER LTX SZ6.5 (GLOVE) ×2 IMPLANT
GOWN STRL REUS W/ TWL LRG LVL3 (GOWN DISPOSABLE) ×2 IMPLANT
GOWN STRL REUS W/TWL 2XL LVL3 (GOWN DISPOSABLE) ×2 IMPLANT
GOWN STRL REUS W/TWL LRG LVL3 (GOWN DISPOSABLE) ×2
KIT BASIN OR (CUSTOM PROCEDURE TRAY) ×2 IMPLANT
KIT MARKER MARGIN INK (KITS) ×2 IMPLANT
LIGHT WAVEGUIDE WIDE FLAT (MISCELLANEOUS) ×2 IMPLANT
NEEDLE HYPO 25GX1X1/2 BEV (NEEDLE) ×2 IMPLANT
NS IRRIG 1000ML POUR BTL (IV SOLUTION) IMPLANT
PACK GENERAL/GYN (CUSTOM PROCEDURE TRAY) ×2 IMPLANT
STRIP CLOSURE SKIN 1/2X4 (GAUZE/BANDAGES/DRESSINGS) ×2 IMPLANT
SUT MNCRL AB 4-0 PS2 18 (SUTURE) ×2 IMPLANT
SUT SILK 2 0 SH (SUTURE) IMPLANT
SUT VIC AB 2-0 SH 27 (SUTURE) ×1
SUT VIC AB 2-0 SH 27XBRD (SUTURE) ×1 IMPLANT
SUT VIC AB 3-0 SH 27 (SUTURE) ×2
SUT VIC AB 3-0 SH 27X BRD (SUTURE) ×2 IMPLANT
SYR CONTROL 10ML LL (SYRINGE) ×2 IMPLANT
TOWEL GREEN STERILE (TOWEL DISPOSABLE) ×2 IMPLANT
TOWEL GREEN STERILE FF (TOWEL DISPOSABLE) ×2 IMPLANT

## 2020-09-29 NOTE — Anesthesia Postprocedure Evaluation (Signed)
Anesthesia Post Note  Patient: Rachael Moore  Procedure(s) Performed: RIGHT BREAST LUMPECTOMY WITH RADIOACTIVE SEED LOCALIZATION (Right: Breast)     Patient location during evaluation: PACU Anesthesia Type: General Level of consciousness: sedated Pain management: pain level controlled Vital Signs Assessment: post-procedure vital signs reviewed and stable Respiratory status: spontaneous breathing and respiratory function stable Cardiovascular status: stable Postop Assessment: no apparent nausea or vomiting Anesthetic complications: no   No notable events documented.  Last Vitals:  Vitals:   09/29/20 0925 09/29/20 0940  BP: 118/61 113/60  Pulse: 82 70  Resp: 15 14  Temp:  36.6 C  SpO2: 98% 97%    Last Pain:  Vitals:   09/29/20 0940  TempSrc:   PainSc: 0-No pain                 Boyde Grieco DANIEL

## 2020-09-29 NOTE — Op Note (Signed)
Right Breast Radioactive seed localized lumpectomy  Indications: This patient presents with history of right breast cancer, upper inner quadrant, grade 1 invasive ductal carcinoma, receptors +/+/-, cT1bN0  Pre-operative Diagnosis: right breast cancer  Post-operative Diagnosis: Same  Surgeon: Stark Klein   Assistant:  Pryor Curia, RNFA  Anesthesia: General endotracheal anesthesia  ASA Class: 2  Procedure Details  The patient was seen in the Holding Room. The risks, benefits, complications, treatment options, and expected outcomes were discussed with the patient. The possibilities of bleeding, infection, the need for additional procedures, failure to diagnose a condition, and creating a complication requiring other procedures or operations were discussed with the patient. The patient concurred with the proposed plan, giving informed consent.  The site of surgery properly noted/marked. The patient was taken to Operating Room # 2, identified, and the procedure verified as right breast seed localized lumpectomy.  The right breast and chest were prepped and draped in standard fashion. A superomedial circumareolar incision was made near the previously placed radioactive seed.  Dissection was carried down around the point of maximum signal intensity. The cautery was used to perform the dissection.   The specimen was inked with the margin marker paint kit.    Specimen radiography confirmed inclusion of the mammographic lesion, the clip, and the seed.  The background signal in the breast was zero.  Additional posterior margin was taken as the seed was close to the margin. The adjacent breast tissue was mobilized away from the skin to partially bridge the defect. This was pulled together with 2-0 vicryl mastopexy sutures. Hemostasis was achieved with cautery.  The cavity was marked with clips on each border other than the anterior border.  The wound was irrigated and closed with 3-0 vicryl interrupted  deep dermal sutures and 4-0 monocryl running subcuticular suture.      Sterile dressings were applied. At the end of the operation, all sponge, instrument, and needle counts were correct.   Findings: Seed, clip in specimen.  Posterior margin is pectoralis, anterior margin is skin, medial margin is medial border of the breast at the lateral sternal border.    Estimated Blood Loss:  min         Specimens: right breast tissue with seed         Complications:  None; patient tolerated the procedure well.         Disposition: PACU - hemodynamically stable.         Condition: stable

## 2020-09-29 NOTE — Discharge Instructions (Addendum)
[facility] Office Phone Number [phone]  BREAST BIOPSY/ PARTIAL MASTECTOMY: POST OP INSTRUCTIONS  Always review your discharge instruction sheet given to you by the facility where your surgery was performed.  IF YOU HAVE DISABILITY OR FAMILY LEAVE FORMS, YOU MUST BRING THEM TO THE OFFICE FOR PROCESSING.  DO NOT GIVE THEM TO YOUR DOCTOR.  A prescription for pain medication may be given to you upon discharge.  Take your pain medication as prescribed, if needed.  If narcotic pain medicine is not needed, then you may take acetaminophen (Tylenol) or ibuprofen (Advil) as needed. Take your usually prescribed medications unless otherwise directed If you need a refill on your pain medication, please contact your pharmacy.  They will contact our office to request authorization.  Prescriptions will not be filled after 5pm or on week-ends. You should eat very light the first 24 hours after surgery, such as soup, crackers, pudding, etc.  Resume your normal diet the day after surgery. Most patients will experience some swelling and bruising in the breast.  Ice packs and a good support bra will help.  Swelling and bruising can take several days to resolve.  It is common to experience some constipation if taking pain medication after surgery.  Increasing fluid intake and taking a stool softener will usually help or prevent this problem from occurring.  A mild laxative (Milk of Magnesia or Miralax) should be taken according to package directions if there are no bowel movements after 48 hours. Unless discharge instructions indicate otherwise, you may remove your bandages 48 hours after surgery, and you may shower at that time.  You may have steri-strips (small skin tapes) in place directly over the incision.  These strips should be left on the skin for 7-10 days.   Any sutures or staples will be removed at the office during your follow-up visit. ACTIVITIES:  You may resume regular daily activities  (gradually increasing) beginning the next day.  Wearing a good support bra or sports bra (or the breast binder) minimizes pain and swelling.  You may have sexual intercourse when it is comfortable. You may drive when you no longer are taking prescription pain medication, you can comfortably wear a seatbelt, and you can safely maneuver your car and apply brakes. RETURN TO WORK:  __________1 week_______________ You should see your doctor in the office for a follow-up appointment approximately two weeks after your surgery.  Your doctor's nurse will typically make your follow-up appointment when she calls you with your pathology report.  Expect your pathology report 2-3 business days after your surgery.  You may call to check if you do not hear from us after three days.   WHEN TO CALL YOUR DOCTOR: Fever over 101.0 Nausea and/or vomiting. Extreme swelling or bruising. Continued bleeding from incision. Increased pain, redness, or drainage from the incision.  The clinic staff is available to answer your questions during regular business hours.  Please don't hesitate to call and ask to speak to one of the nurses for clinical concerns.  If you have a medical emergency, go to the nearest emergency room or call 911.  A surgeon from [facility] is always on call at the hospital.  For further questions, please visit [email or web address]   

## 2020-09-29 NOTE — Anesthesia Procedure Notes (Signed)
Procedure Name: LMA Insertion Date/Time: 09/29/2020 7:49 AM Performed by: Renato Shin, CRNA Pre-anesthesia Checklist: Patient identified, Emergency Drugs available, Suction available and Patient being monitored Patient Re-evaluated:Patient Re-evaluated prior to induction Oxygen Delivery Method: Circle system utilized Preoxygenation: Pre-oxygenation with 100% oxygen Induction Type: IV induction LMA: LMA inserted LMA Size: 4.0 Number of attempts: 1 Placement Confirmation: positive ETCO2 and breath sounds checked- equal and bilateral Tube secured with: Tape Dental Injury: Teeth and Oropharynx as per pre-operative assessment

## 2020-09-29 NOTE — Transfer of Care (Signed)
Immediate Anesthesia Transfer of Care Note  Patient: Rachael Moore  Procedure(s) Performed: RIGHT BREAST LUMPECTOMY WITH RADIOACTIVE SEED LOCALIZATION (Right: Breast)  Patient Location: PACU  Anesthesia Type:General  Level of Consciousness: drowsy and patient cooperative  Airway & Oxygen Therapy: Patient Spontanous Breathing and Patient connected to nasal cannula oxygen  Post-op Assessment: Report given to RN and Post -op Vital signs reviewed and stable  Post vital signs: Reviewed and stable  Last Vitals:  Vitals Value Taken Time  BP 136/65 09/29/20 0910  Temp 36.6 C 09/29/20 0910  Pulse 74 09/29/20 0912  Resp 15 09/29/20 0912  SpO2 99 % 09/29/20 0912  Vitals shown include unvalidated device data.  Last Pain:  Vitals:   09/29/20 0601  TempSrc:   PainSc: 0-No pain         Complications: No notable events documented.

## 2020-09-29 NOTE — Interval H&P Note (Signed)
History and Physical Interval Note:  09/29/2020 7:32 AM  Rachael Moore  has presented today for surgery, with the diagnosis of RIGHT BREAST CANCER.  The various methods of treatment have been discussed with the patient and family. After consideration of risks, benefits and other options for treatment, the patient has consented to  Procedure(s): RIGHT BREAST LUMPECTOMY WITH RADIOACTIVE SEED LOCALIZATION (Right) as a surgical intervention.  The patient's history has been reviewed, patient examined, no change in status, stable for surgery.  I have reviewed the patient's chart and labs.  Questions were answered to the patient's satisfaction.     Stark Klein

## 2020-09-30 ENCOUNTER — Encounter (HOSPITAL_COMMUNITY): Payer: Self-pay | Admitting: General Surgery

## 2020-10-04 LAB — SURGICAL PATHOLOGY

## 2020-10-05 ENCOUNTER — Encounter: Payer: Self-pay | Admitting: *Deleted

## 2020-10-07 ENCOUNTER — Telehealth: Payer: Self-pay | Admitting: *Deleted

## 2020-10-07 ENCOUNTER — Telehealth: Payer: Self-pay | Admitting: Hematology

## 2020-10-07 NOTE — Telephone Encounter (Signed)
Received msg from pt requesting appt to see Dr. Burr Medico to discuss AI and if xrt is necessary based on her final path. Scheduled appt for 7/19 at 11am.

## 2020-10-07 NOTE — Telephone Encounter (Signed)
Scheduled appointment per 07/15 sch msg. Patient is aware. 

## 2020-10-11 ENCOUNTER — Ambulatory Visit: Payer: Medicare Other | Admitting: Hematology

## 2020-10-13 ENCOUNTER — Encounter: Payer: Self-pay | Admitting: General Practice

## 2020-10-14 ENCOUNTER — Other Ambulatory Visit: Payer: Self-pay

## 2020-10-14 ENCOUNTER — Inpatient Hospital Stay: Payer: Medicare Other | Attending: Hematology | Admitting: Hematology

## 2020-10-14 VITALS — BP 133/61 | HR 67 | Temp 98.0°F | Resp 17 | Wt 159.2 lb

## 2020-10-14 DIAGNOSIS — Z170 Estrogen receptor positive status [ER+]: Secondary | ICD-10-CM | POA: Insufficient documentation

## 2020-10-14 DIAGNOSIS — C50211 Malignant neoplasm of upper-inner quadrant of right female breast: Secondary | ICD-10-CM | POA: Insufficient documentation

## 2020-10-14 DIAGNOSIS — C7951 Secondary malignant neoplasm of bone: Secondary | ICD-10-CM | POA: Insufficient documentation

## 2020-10-14 DIAGNOSIS — E039 Hypothyroidism, unspecified: Secondary | ICD-10-CM | POA: Insufficient documentation

## 2020-10-14 MED ORDER — TAMOXIFEN CITRATE 20 MG PO TABS: 20.0000 mg | ORAL_TABLET | Freq: Every day | ORAL | 5 refills | Status: DC

## 2020-10-14 NOTE — Progress Notes (Signed)
[facility]   [phone] [phone]   Clinic Follow up Note   Patient Care Team: Kristen Loader, FNP as PCP - General (Family Medicine) Rockwell Germany, RN as Oncology Nurse Navigator Mauro Kaufmann, RN as Oncology Nurse Navigator Stark Klein, MD as Consulting Physician (General Surgery) Truitt Merle, MD as Consulting Physician (Hematology) Eppie Gibson, MD as Attending Physician (Radiation Oncology) 10/14/2020  CHIEF COMPLAINT: f/u right breast cancer   SUMMARY OF ONCOLOGIC HISTORY: Oncology History Overview Note  Cancer Staging Malignant neoplasm of upper-inner quadrant of right breast in female, estrogen receptor positive ([place]) Staging form: Breast, AJCC 8th Edition - Clinical stage from 08/23/2020: Stage IA (cT1b, cN0, cM0, G1, ER+, PR+, HER2-) - Signed by Truitt Merle, MD on 08/30/2020 Histologic grading system: 3 grade system - Pathologic stage from 09/29/2020: Stage Unknown (pT1b, pNX, cM0, G1, ER+, PR+, HER2-) - Signed by Truitt Merle, MD on 10/15/2020 Stage prefix: Initial diagnosis Histologic grading system: 3 grade system Residual tumor (R): R0 - None    Malignant neoplasm of upper-inner quadrant of right breast in female, estrogen receptor positive ([place])  08/15/2020 Mammogram   an irregular hypoechoic mass in the RIGHT breast at the 2 o'clock axis, 6 cm from the nipple, measuring 8 x 5 mm, with internal vascularity, with associated architectural distortion, corresponding to the mammographic finding.   There is questionable intraductal extension peripheral to the mass, as manifested by mild ductal prominence up to 2 cm from the mass.   RIGHT axilla was evaluated with ultrasound showing no enlarged or morphologically abnormal lymph nodes   08/23/2020 Initial Biopsy   Diagnosis Breast, right, needle core biopsy, 2 o'clock - INVASIVE DUCTAL CARCINOMA - CALCIFICATIONS - SEE COMMENT  Microscopic Comment Based on the biopsy, the  carcinoma appears Nottingham grade 1 of 3 and measures 0.4 cm in greatest linear extent. Prognostic markers (ER/PR/ki-67/HER2) are pending and will be reported in an addendum. Dr. Saralyn Pilar reviewed the case and agrees with the above diagnosis. These results were called to [facility] on August 24, 2020.    08/23/2020 Receptors her2   PROGNOSTIC INDICATORS Results: IMMUNOHISTOCHEMICAL AND MORPHOMETRIC ANALYSIS PERFORMED MANUALLY The tumor cells are EQUIVOCAL for Her2 (2+). Her2 by FISH will be performed and results reported separately. Estrogen Receptor: 100%, POSITIVE, STRONG STAINING INTENSITY Progesterone Receptor: 90%, POSITIVE, STRONG STAINING INTENSITY Proliferation Marker Ki67: 1%   FLUORESCENCE IN-SITU HYBRIDIZATION Results: GROUP 5: HER2 **NEGATIVE** Equivocal form of amplification of the HER2 gene was detected in the IHC 2+ tissue sample received from this individual. HER2 FISH was performed by a technologist and cell imaging and analysis on the [employer].   08/23/2020 Cancer Staging   Staging form: Breast, AJCC 8th Edition - Clinical stage from 08/23/2020: Stage IA (cT1b, cN0, cM0, G1, ER+, PR+, HER2-) - Signed by Truitt Merle, MD on 08/30/2020  Histologic grading system: 3 grade system    08/25/2020 Initial Diagnosis   Malignant neoplasm of upper-inner quadrant of right breast in female, estrogen receptor positive ([place])   09/29/2020 Cancer Staging   Staging form: Breast, AJCC 8th Edition - Pathologic stage from 09/29/2020: Stage Unknown (pT1b, pNX, cM0, G1, ER+, PR+, HER2-) - Signed by Truitt Merle, MD on 10/15/2020  Stage prefix: Initial diagnosis  Histologic grading system: 3 grade system  Residual tumor (R): R0 - None      CURRENT THERAPY: pending adjuvant tamoxifen   INTERVAL HISTORY: Rachael Moore is here for follow-up.  She presents to clinic with  her husband today.  She had lumpectomy 2 weeks ago, recovering well, mild pain at incision site, but not need pain  meds  Energy and appetite is back to normal  She has mild arthritis in hands and knee   REVIEW OF SYSTEMS:   Constitutional: Denies fevers, chills or abnormal weight loss Eyes: Denies blurriness of vision Ears, nose, mouth, throat, and face: Denies mucositis or sore throat Respiratory: Denies cough, dyspnea or wheezes Cardiovascular: Denies palpitation, chest discomfort or lower extremity swelling Gastrointestinal:  Denies nausea, heartburn or change in bowel habits Skin: Denies abnormal skin rashes Lymphatics: Denies new lymphadenopathy or easy bruising Neurological:Denies numbness, tingling or new weaknesses Behavioral/Psych: Mood is stable, no new changes  All other systems were reviewed with the patient and are negative.  MEDICAL HISTORY:  Past Medical History:  Diagnosis Date   Breast cancer ([place])    Hyperlipidemia with target low density lipoprotein (LDL) cholesterol less than 100 mg/dL    Hypertension    Hypothyroidism 2009   Thyroid disease     SURGICAL HISTORY: Past Surgical History:  Procedure Laterality Date   BREAST LUMPECTOMY WITH RADIOACTIVE SEED LOCALIZATION Right 09/29/2020   Procedure: RIGHT BREAST LUMPECTOMY WITH RADIOACTIVE SEED LOCALIZATION;  Surgeon: Stark Klein, MD;  Location: [facility];  Service: General;  Laterality: Right;   right knee surgery     TONSILLECTOMY      I have reviewed the social history and family history with the patient and they are unchanged from previous note.  ALLERGIES:  is allergic to cetylpyridinium.  MEDICATIONS:  Current Outpatient Medications  Medication Sig Dispense Refill   acetaminophen (TYLENOL) 500 MG tablet Take 500-1,000 mg by mouth every 6 (six) hours as needed (pain).     Coenzyme Q10 (COQ10 PO) Take 1 capsule by mouth daily at 12 noon. midday     levothyroxine (SYNTHROID) 50 MCG tablet Take 50 mcg by mouth daily before breakfast.     Multiple Vitamin (MULTIVITAMIN WITH MINERALS) TABS tablet Take 1 tablet by mouth  daily at 12 noon. midday     Multiple Vitamins-Minerals (IMMUNE SUPPORT PO) Take 1 tablet by mouth daily at 12 noon. midday     oxyCODONE (OXY IR/ROXICODONE) 5 MG immediate release tablet Take 1 tablet (5 mg total) by mouth every 6 (six) hours as needed for severe pain. 10 tablet 0   pravastatin (PRAVACHOL) 40 MG tablet Take 40 mg by mouth at bedtime.     No current facility-administered medications for this visit.    PHYSICAL EXAMINATION: ECOG PERFORMANCE STATUS: 0 - Asymptomatic  Vitals:   10/14/20 1142  BP: 133/61  Pulse: 67  Resp: 17  Temp: 98 F (36.7 C)  SpO2: 100%   Filed Weights   10/14/20 1142  Weight: 159 lb 3.2 oz (72.2 kg)    GENERAL:alert, no distress and comfortable SKIN: skin color, texture, turgor are normal, no rashes or significant lesions EYES: normal, Conjunctiva are pink and non-injected, sclera clear OROPHARYNX:no exudate, no erythema and lips, buccal mucosa, and tongue normal  NECK: supple, thyroid normal size, non-tender, without nodularity LYMPH:  no palpable lymphadenopathy in the cervical, axillary or inguinal LUNGS: clear to auscultation and percussion with normal breathing effort HEART: regular rate & rhythm and no murmurs and no lower extremity edema ABDOMEN:abdomen soft, non-tender and normal bowel sounds Musculoskeletal:no cyanosis of digits and no clubbing  NEURO: alert & oriented x 3 with fluent speech, no focal motor/sensory deficits Breasts: Breast inspection showed them to be symmetrical with no  nipple discharge.  Surgical incision in the right breast has healed well.  Palpation of the breasts and axilla revealed no obvious mass that I could appreciate.   LABORATORY DATA:  I have reviewed the data as listed CBC Latest Ref Rng & Units 09/21/2020 08/31/2020  WBC 4.0 - 10.5 K/uL 7.4 5.7  Hemoglobin 12.0 - 15.0 g/dL 14.8 14.6  Hematocrit 36.0 - 46.0 % 44.0 42.6  Platelets 150 - 400 K/uL 328 287     CMP Latest Ref Rng & Units 09/21/2020  08/31/2020  Glucose 70 - 99 mg/dL 91 95  BUN 8 - 23 mg/dL 8 13  Creatinine 0.44 - 1.00 mg/dL 0.75 0.89  Sodium 135 - 145 mmol/L 138 142  Potassium 3.5 - 5.1 mmol/L 3.7 4.6  Chloride 98 - 111 mmol/L 107 106  CO2 22 - 32 mmol/L 26 26  Calcium 8.9 - 10.3 mg/dL 9.4 10.0  Total Protein 6.5 - 8.1 g/dL - 7.2  Total Bilirubin 0.3 - 1.2 mg/dL - 0.8  Alkaline Phos 38 - 126 U/L - 43  AST 15 - 41 U/L - 27  ALT 0 - 44 U/L - 8      RADIOGRAPHIC STUDIES: I have personally reviewed the radiological images as listed and agreed with the findings in the report. No results found.   ASSESSMENT & PLAN:  Lorry Furber is a 70 y.o. Caucasian female with a history of Hypothyroidism and HTN     1. Malignant neoplasm of upper-inner quadrant of right breast, Stage IA, pT1bN0M0, ER+/PR+/HER2-, Grade I --I discussed her surgical path result in details.  She had a 0.9 cm grade 1 invasive ductal cancer.  Surgical margins were widely negative.  Due to her age and early stage disease, sentinel lymph node biopsy was not performed. -Given the small size of tumor, low-grade, both ER and PR positive disease and HER2 negative, I do not think she needs Oncotype test, this is likely low risk disease. -We discussed the small benefit of adjuvant radiation to reduce her risk of local recurrence.  However, her risk of local recurrence is likely less than 5% in the next 5 years based on the recent LUMINA trial data. Patient is not very interested in adjuvant radiation.  I have discussed this with Dr. Barry Dienes and Dr. Isidore Moos, we all feel comfortable for her to skip adjuvant radiation. --Giving the strong ER and PR expression in her tumor and her postmenopausal status, I recommend adjuvant endocrine therapy with AI or tamoxifen for 5-10 years, to reduce the risk of cancer recurrence.  Due to her moderate arthritis, and osteoporosis, I think tamoxifen is a better option for her. ---The potential side effects, which includes but not  limited to, hot flash, skin and vaginal dryness, slightly increased risk of cardiovascular disease and cataract, small risk of thrombosis and endometrial cancer, were discussed with her in great details. Preventive strategies for thrombosis, such as being physically active, using compression stocks, avoid cigarette smoking, etc., were reviewed with her. I also recommend her to follow-up with her gynecologist once a year, and watch for vaginal spotting or bleeding, as a clinically sign of endometrial cancer, etc. She voiced good understanding, and agrees to proceed. Will start in 2-4 weeks when she recovers completely from surgery  2.  Osteoporosis -Her bone density scan from November 2020 showed a T score -3.4 at AP spine, she has osteoporosis. -I encouraged her to take over-the-counter calcium and vitamin D supplement, and do weightbearing exercise.  She agrees. -  I discussed that tamoxifen is likely going to strength her bone, and we will monitor her bone density scan every 2 years -I also recommend Zometa every 6 months for 2 years, which will increase her bone density, and also reduce metastatic breast cancer in bones.  I reviewed the benefit and side effects, especially the small risk of jaw necrosis with her in detail.  She plan to discuss with her dentist, and let me know her decision on next visit.   Plan -I called in tamoxifen 20 mg daily to her pharmacy, she will start in the next 2 to 4 weeks -Survivorship with [facility] in 3 months -Lab and follow-up in 6 months  No orders of the defined types were placed in this encounter.  All questions were answered. The patient knows to call the clinic with any problems, questions or concerns. No barriers to learning was detected. I spent 30 minutes counseling the patient face to face. The total time spent in the appointment was 40 minutes and more than 50% was on counseling and review of test results     Truitt Merle, MD 10/14/20

## 2020-10-15 ENCOUNTER — Encounter: Payer: Self-pay | Admitting: Hematology

## 2020-10-17 ENCOUNTER — Encounter: Payer: Self-pay | Admitting: Hematology

## 2020-10-17 ENCOUNTER — Encounter: Payer: Self-pay | Admitting: *Deleted

## 2020-10-17 DIAGNOSIS — Z170 Estrogen receptor positive status [ER+]: Secondary | ICD-10-CM

## 2020-10-17 DIAGNOSIS — C50211 Malignant neoplasm of upper-inner quadrant of right female breast: Secondary | ICD-10-CM

## 2020-10-17 NOTE — Progress Notes (Signed)
Received email from patient regarding applying for assistance.  Emailed patient back and advised to contact at her earliest convenience.  Patient called and I provided the income guidelines for the grant for her household size and she states they are over the guidelines and the income will remain the same. She asked about other options such as assistance with oral medication she will be receiving. Advised I would reach out to pharmacy team regarding this concern and they will reach out to her directly.  She has my card for any additional financial questions or concerns.   Staff message sent to [facility] in oral chemo.

## 2020-10-26 ENCOUNTER — Other Ambulatory Visit: Payer: Self-pay | Admitting: Family Medicine

## 2020-10-26 DIAGNOSIS — M810 Age-related osteoporosis without current pathological fracture: Secondary | ICD-10-CM

## 2020-10-26 DIAGNOSIS — E2839 Other primary ovarian failure: Secondary | ICD-10-CM

## 2020-11-01 ENCOUNTER — Ambulatory Visit: Payer: Medicare Other

## 2020-11-01 ENCOUNTER — Ambulatory Visit: Payer: Medicare Other | Admitting: Radiation Oncology

## 2020-11-02 ENCOUNTER — Ambulatory Visit: Payer: Medicare Other | Admitting: Radiation Oncology

## 2020-11-02 ENCOUNTER — Other Ambulatory Visit: Payer: Self-pay | Admitting: Adult Health

## 2020-11-02 DIAGNOSIS — C50211 Malignant neoplasm of upper-inner quadrant of right female breast: Secondary | ICD-10-CM

## 2020-11-10 DIAGNOSIS — E039 Hypothyroidism, unspecified: Secondary | ICD-10-CM | POA: Diagnosis not present

## 2020-11-10 DIAGNOSIS — M810 Age-related osteoporosis without current pathological fracture: Secondary | ICD-10-CM | POA: Diagnosis not present

## 2020-11-15 ENCOUNTER — Encounter: Payer: Self-pay | Admitting: Hematology

## 2020-11-17 ENCOUNTER — Encounter: Payer: Self-pay | Admitting: Nurse Practitioner

## 2020-11-18 ENCOUNTER — Telehealth: Payer: Self-pay

## 2020-11-18 NOTE — Telephone Encounter (Signed)
This nurse received a call from this patient stating My PA has put her on Fosamax for bone health and she is  not comfortable with the possible side effects in regards to her teeth and jaw health. It has been suggested by a family nurse  that she go on Magnesium oxide instead and she just wants to make sure that is ok with Dr. Burr Medico and the dosage that she should take to improve her bone health. She has a bone density scan in late January 2023. She also has Osteoporosis so she is trying to be proactive. No further questions or concerns.  MD made aware.

## 2020-11-30 ENCOUNTER — Telehealth: Payer: Self-pay

## 2020-11-30 NOTE — Telephone Encounter (Signed)
This nurse attempted to reach patient related to MyChart  message about taking Magnesium.  There  was no answer and unable to leave a message.

## 2020-12-27 ENCOUNTER — Other Ambulatory Visit: Payer: Self-pay

## 2020-12-27 DIAGNOSIS — Z170 Estrogen receptor positive status [ER+]: Secondary | ICD-10-CM

## 2020-12-27 MED ORDER — TAMOXIFEN CITRATE 20 MG PO TABS: 20.0000 mg | ORAL_TABLET | Freq: Every day | ORAL | 5 refills | Status: DC

## 2021-01-02 NOTE — Progress Notes (Signed)
[facility]:  Survivorship   Patient Care Team: Rachael Loader, FNP as PCP - General (Family Medicine) Rachael Germany, RN as Oncology Nurse Navigator Rachael Kaufmann, RN as Oncology Nurse Navigator Rachael Klein, MD as Consulting Physician (General [facility]) Rachael Merle, MD as Consulting Physician (Hematology) Rachael Gibson, MD as Attending Physician (Radiation Oncology) Rachael Feeling, NP as Nurse Practitioner (Nurse Practitioner)   REASON FOR VISIT:  Routine follow-up post-treatment for a recent history of breast cancer.  BRIEF ONCOLOGIC HISTORY:  Oncology History Overview Note  Cancer Staging Malignant neoplasm of upper-inner quadrant of right breast in female, estrogen receptor positive ([place]) Staging form: Breast, AJCC 8th Edition - Clinical stage from 08/23/2020: Stage IA (cT1b, cN0, cM0, G1, ER+, PR+, HER2-) - Signed by Rachael Merle, MD on 08/30/2020 Histologic grading system: 3 grade system - Pathologic stage from 09/29/2020: Stage Unknown (pT1b, pNX, cM0, G1, ER+, PR+, HER2-) - Signed by Rachael Merle, MD on 10/15/2020 Stage prefix: Initial diagnosis Histologic grading system: 3 grade system Residual tumor (R): R0 - None    Malignant neoplasm of upper-inner quadrant of right breast in female, estrogen receptor positive ([place])  08/15/2020 Mammogram   an irregular hypoechoic mass in the RIGHT breast at the 2 o'clock axis, 6 cm from the nipple, measuring 8 x 5 mm, with internal vascularity, with associated architectural distortion, corresponding to the mammographic finding.   There is questionable intraductal extension peripheral to the mass, as manifested by mild ductal prominence up to 2 cm from the mass.   RIGHT axilla was evaluated with ultrasound showing no enlarged or morphologically abnormal lymph nodes   08/23/2020 Initial Biopsy   Diagnosis Breast, right, needle core biopsy, 2 o'clock - INVASIVE DUCTAL CARCINOMA - CALCIFICATIONS - SEE COMMENT  Microscopic Comment Based  on the biopsy, the carcinoma appears Nottingham grade 1 of 3 and measures 0.4 cm in greatest linear extent. Prognostic markers (ER/PR/ki-67/HER2) are pending and will be reported in an addendum. [facility] reviewed the case and agrees with the above diagnosis. These results were called to [facility] on August 24, 2020.    08/23/2020 Receptors her2   PROGNOSTIC INDICATORS Results: IMMUNOHISTOCHEMICAL AND MORPHOMETRIC ANALYSIS PERFORMED MANUALLY The tumor cells are EQUIVOCAL for Her2 (2+). Her2 by FISH will be performed and results reported separately. Estrogen Receptor: 100%, POSITIVE, STRONG STAINING INTENSITY Progesterone Receptor: 90%, POSITIVE, STRONG STAINING INTENSITY Proliferation Marker Ki67: 1%   FLUORESCENCE IN-SITU HYBRIDIZATION Results: GROUP 5: HER2 **NEGATIVE** Equivocal form of amplification of the HER2 gene was detected in the IHC 2+ tissue sample received from this individual. HER2 FISH was performed by a technologist and cell imaging and analysis on the BioView.   08/23/2020 Cancer Staging   Staging form: Breast, AJCC 8th Edition - Clinical stage from 08/23/2020: Stage IA (cT1b, cN0, cM0, G1, ER+, PR+, HER2-) - Signed by Rachael Merle, MD on 08/30/2020 Histologic grading system: 3 grade system   08/25/2020 Initial Diagnosis   Malignant neoplasm of upper-inner quadrant of right breast in female, estrogen receptor positive ([place])   09/29/2020 Cancer Staging   Staging form: Breast, AJCC 8th Edition - Pathologic stage from 09/29/2020: Stage Unknown (pT1b, pNX, cM0, G1, ER+, PR+, HER2-) - Signed by Rachael Merle, MD on 10/15/2020 Stage prefix: Initial diagnosis Histologic grading system: 3 grade system Residual tumor (R): R0 - None     INTERVAL HISTORY:  Rachael [facility] presents to the [facility] today for our initial meeting to review her survivorship care plan detailing  her treatment course for breast cancer, as well as monitoring long-term side effects of  that treatment, education regarding health maintenance, screening, and overall wellness and health promotion.     Overall, Rachael [facility] is doing well.  Scar tissue has resolved.  She has no new complaints in the breast such as new lump/mass, nipple discharge or inversion, or skin change.  She is tolerating tamoxifen with mild occasional hot flash.  Denies bone or joint pain, mood fluctuation, vaginal bleeding, or signs of thrombosis.   ONCOLOGY TREATMENT TEAM:  1. Surgeon:  R[facility]ael [facility] at [facility] 2. Medical Oncologist: Rachael [facility] 3.  Consulting radiation Oncologist: Rach[facility]    PAST MEDICAL/SURGICAL HISTORY:  Past Medical History:  Diagnosis Date   Breast cancer ([place])    Hyperlipidemia with target low density lipoprotein (LDL) cholesterol less than 100 mg/dL    Hypertension    Hypothyroidism 2009   Thyroid disease    Past Surgical History:  Procedure Laterality Date   BREAST LUMPECTOMY WITH RADIOACTIVE SEED LOCALIZATION Right 09/29/2020   Procedure: RIGHT BREAST LUMPECTOMY WITH RADIOACTIVE SEED LOCALIZATION;  Surgeon: Rachael Klein, MD;  Location: [facility];  Service: General;  Laterality: Right;   right knee [facility]     TONSILLECTOMY       ALLERGIES:  Allergies  Allergen Reactions   Cetylpyridinium Swelling    With benzocaine (Cepacaine)--numbing agent     CURRENT MEDICATIONS:  Outpatient Encounter Medications as of 01/03/2021  Medication Sig   acetaminophen (TYLENOL) 500 MG tablet Take 500-1,000 mg by mouth every 6 (six) hours as needed (pain).   Calcium Carb-Cholecalciferol (CALCIUM 500 + D3) 500-600 MG-UNIT TABS Take 1 tablet by mouth daily.   Coenzyme Q10 (COQ10 PO) Take 1 capsule by mouth daily at 12 noon. midday   levothyroxine (SYNTHROID) 50 MCG tablet Take 50 mcg by mouth daily before breakfast.   Magnesium 250 MG TABS Take 1 tablet by mouth daily.   Multiple Vitamins-Minerals (IMMUNE SUPPORT PO) Take 1 tablet by mouth daily at 12 noon. midday    Multiple Vitamins-Minerals (MULTIVITAMIN ADULTS 50+) TABS Take 1 tablet by mouth daily.   pravastatin (PRAVACHOL) 40 MG tablet Take 40 mg by mouth at bedtime.   tamoxifen (NOLVADEX) 20 MG tablet Take 1 tablet (20 mg total) by mouth daily.   [DISCONTINUED] Multiple Vitamin (MULTIVITAMIN WITH MINERALS) TABS tablet Take 1 tablet by mouth daily at 12 noon. midday   [DISCONTINUED] tamoxifen (NOLVADEX) 20 MG tablet Take 1 tablet (20 mg total) by mouth daily.   No facility-administered encounter medications on file as of 01/03/2021.     ONCOLOGIC FAMILY HISTORY:  Family History  Problem Relation Age of Onset   Breast cancer Maternal Grandmother      GENETIC COUNSELING/TESTING: No  SOCIAL HISTORY:  Dillan Lunden is married and lives with her spouse in [place], [place].  She denies any current or history of tobacco use.     PHYSICAL EXAMINATION:  Vital Signs:   Vitals:   01/03/21 0908  BP: 107/74  Pulse: 60  Resp: 18  Temp: 98.3 F (36.8 C)  SpO2: 100%   Filed Weights   01/03/21 0908  Weight: 162 lb (73.5 kg)   General: Well-nourished, well-appearing female in no acute distress.   HEENT:  Sclerae anicteric.  Lymph: No cervical, supraclavicular, or infraclavicular lymphadenopathy noted on palpation.  Respiratory: breathing non-labored.  Neuro: No focal deficits. Steady gait.  Psych: Mood and affect normal and appropriate for situation.  Extremities: No edema.  MSK: Full range of motion in bilateral upper extremities Skin: Warm and dry, no rash Breasts are symmetrical without nipple discharge or inversion.  S/p right lumpectomy, incision completely healed with minimal scar tissue at the areola.  No palpable mass or nodularity in either breast or axilla that I could appreciate.  LABORATORY DATA:  None for this visit.  DIAGNOSTIC IMAGING:  None for this visit.      ASSESSMENT AND PLAN:  Ms.. Wendel is a pleasant 70 y.o. female with Stage 1A right  breast invasive ductal carcinoma, ER+/PR+/HER2-, diagnosed in 07/2020 treated with lumpectomy and anti-estrogen therapy with tamoxifen beginning in 10/2020.  She presents to the Survivorship [facility] for our initial meeting and routine follow-up post-completion of treatment for breast cancer.    1. Stage 1A right breast cancer:  Ms. Fales has recovered well from definitive treatment for breast cancer. She will follow-up with her medical oncologist, Rachael [facility] in 04/2021 with history and physical exam per surveillance protocol.  She will continue her anti-estrogen therapy with tamoxifen. Thus far, she is tolerating well, with minimal side effects. She was instructed to make Rachael [facility] or myself aware if she begins to experience any worsening side effects of the medication and I could see her back in [facility] to help manage those side effects, as needed. Though the incidence is low, there is an associated risk of endometrial cancer with anti-estrogen therapies like Tamoxifen.  Ms. Driskill was encouraged to contact Rachael [facility] or myself with any vaginal bleeding while taking Tamoxifen. Other side effects of Tamoxifen were again reviewed with her as well. Today, a comprehensive survivorship care plan and treatment summary was reviewed with the patient today detailing her breast cancer diagnosis, treatment course, potential late/long-term effects of treatment, appropriate follow-up care with recommendations for the future, and patient education resources.  A copy of this summary, along with a letter will be sent to the patient's primary care provider via In Basket message after today's visit.    2. Bone health:  Given Ms. Simao age/history of breast cancer and osteoporosis, tamoxifen was selected as the preferred antiestrogen therapy.  Her last DEXA scan was 02/16/2019 which showed osteoporosis at the AP spine lowest T score -3.4.  We discussed bisphosphonate such as Zometa including the potential risk and  benefit in detail.  She prefers to hold for now while maximizing calcium, vitamin D, and weightbearing exercise.  She is scheduled for repeat DEXA on 04/24/2021, will discuss results and potential treatment at her visit with Rachael [facility] after that.  In the meantime, she was encouraged to increase her consumption of foods rich in calcium, as well as increase her weight-bearing activities.  She was given education on specific activities to promote bone health.  3. Cancer screening:  Due to Ms. Lye history and her age, she should receive screening for skin cancers, colon cancer, and gynecologic cancers.  The information and recommendations are listed on the patient's comprehensive care plan/treatment summary and were reviewed in detail with the patient.  She is being referred to dermatology for routine skin check and Dr. Theadore Nan for pelvic exam.  4. Health maintenance and wellness promotion: Ms. Jenison was encouraged to consume 5-7 servings of fruits and vegetables per day. We reviewed the "Nutrition Rainbow" handout. She was also encouraged to engage in moderate to vigorous exercise for 30 minutes per day most days of the week. We discussed the [employer] fitness program, which is designed for cancer survivors to help them become  more physically fit after cancer treatments.  She was instructed to limit her alcohol consumption and continue to abstain from tobacco use.     5. Support services/counseling: It is not uncommon for this period of the patient's cancer care trajectory to be one of many emotions and stressors.  We discussed an opportunity for her to participate in the next session of [facility] ("Finding Your New Normal") support group series designed for patients after they have completed treatment.   Ms. Wooden was encouraged to take advantage of our many other support services programs, support groups, and/or counseling in coping with her new life as a cancer survivor after  completing anti-cancer treatment.  She was offered support today through active listening and expressive supportive counseling.  She was given information regarding our available services and encouraged to contact me with any questions or for help enrolling in any of our support group/programs.    Dispo:   -DEXA 04/24/21 -Return to cancer center 04/27/21  -Mammogram due in 07/2021 -Follow up with [facility] as indicated -She is welcome to return back to the Survivorship [facility] at any time; no additional follow-up needed at this time.  -Consider referral back to survivorship as a long-term survivor for continued surveillance   Orders Placed This Encounter  Procedures   Ambulatory referral to Dermatology    Referral Priority:   Routine    Referral Type:   Consultation    Referral Reason:   Specialty Services Required    Requested Specialty:   Dermatology    Number of Visits Requested:   1   Ambulatory referral to Internal Medicine    Referral Priority:   Routine    Referral Type:   Consultation    Referral Reason:   Specialty Services Required    Requested Specialty:   Internal Medicine    Number of Visits Requested:   1    A total of (40) minutes of face-to-face time was spent with this patient with greater than 50% of that time in counseling and care-coordination.   Cira Rue, NP Survivorship Program [facility] [phone]   Note: PRIMARY CARE PROVIDER [facility], [place] [phone]

## 2021-01-03 ENCOUNTER — Telehealth: Payer: Self-pay | Admitting: Nurse Practitioner

## 2021-01-03 ENCOUNTER — Other Ambulatory Visit: Payer: Self-pay

## 2021-01-03 ENCOUNTER — Encounter: Payer: Medicare Other | Admitting: Nurse Practitioner

## 2021-01-03 ENCOUNTER — Encounter: Payer: Self-pay | Admitting: Nurse Practitioner

## 2021-01-03 ENCOUNTER — Inpatient Hospital Stay: Payer: Medicare Other | Attending: Hematology | Admitting: Nurse Practitioner

## 2021-01-03 VITALS — BP 107/74 | HR 60 | Temp 98.3°F | Resp 18 | Ht 68.0 in | Wt 162.0 lb

## 2021-01-03 DIAGNOSIS — C50211 Malignant neoplasm of upper-inner quadrant of right female breast: Secondary | ICD-10-CM | POA: Diagnosis not present

## 2021-01-03 DIAGNOSIS — Z79810 Long term (current) use of selective estrogen receptor modulators (SERMs): Secondary | ICD-10-CM | POA: Insufficient documentation

## 2021-01-03 DIAGNOSIS — Z01419 Encounter for gynecological examination (general) (routine) without abnormal findings: Secondary | ICD-10-CM | POA: Diagnosis not present

## 2021-01-03 DIAGNOSIS — Z170 Estrogen receptor positive status [ER+]: Secondary | ICD-10-CM | POA: Diagnosis not present

## 2021-01-03 DIAGNOSIS — Z1283 Encounter for screening for malignant neoplasm of skin: Secondary | ICD-10-CM | POA: Diagnosis not present

## 2021-01-03 NOTE — Telephone Encounter (Signed)
Rescheduled upcoming appointment per 10/11 los. Patient is aware of changes.

## 2021-01-04 ENCOUNTER — Encounter: Payer: Self-pay | Admitting: Nurse Practitioner

## 2021-01-14 ENCOUNTER — Encounter: Payer: Self-pay | Admitting: Nurse Practitioner

## 2021-01-16 ENCOUNTER — Encounter: Payer: Self-pay | Admitting: Nurse Practitioner

## 2021-01-18 NOTE — Progress Notes (Signed)
Faxed referral to Dr. Theadore Nan and confirmation received. PH: [phone] FX: [phone]

## 2021-02-01 NOTE — Telephone Encounter (Signed)
Chart Review only on 11/30/2020.  No changes made at that time.

## 2021-04-07 DIAGNOSIS — Z170 Estrogen receptor positive status [ER+]: Secondary | ICD-10-CM | POA: Diagnosis not present

## 2021-04-07 DIAGNOSIS — C50211 Malignant neoplasm of upper-inner quadrant of right female breast: Secondary | ICD-10-CM | POA: Diagnosis not present

## 2021-04-20 ENCOUNTER — Other Ambulatory Visit: Payer: Medicare Other

## 2021-04-20 ENCOUNTER — Ambulatory Visit: Payer: Medicare Other | Admitting: Hematology

## 2021-04-24 ENCOUNTER — Other Ambulatory Visit: Payer: Self-pay

## 2021-04-24 ENCOUNTER — Ambulatory Visit
Admission: RE | Admit: 2021-04-24 | Discharge: 2021-04-24 | Disposition: A | Discharge status: Home / Self Care | Payer: Medicare Other | Source: Ambulatory Visit | Attending: Family Medicine | Admitting: Family Medicine

## 2021-04-24 DIAGNOSIS — M85852 Other specified disorders of bone density and structure, left thigh: Secondary | ICD-10-CM | POA: Diagnosis not present

## 2021-04-24 DIAGNOSIS — M810 Age-related osteoporosis without current pathological fracture: Secondary | ICD-10-CM

## 2021-04-24 DIAGNOSIS — Z780 Asymptomatic menopausal state: Secondary | ICD-10-CM | POA: Diagnosis not present

## 2021-04-24 DIAGNOSIS — E2839 Other primary ovarian failure: Secondary | ICD-10-CM

## 2021-04-27 ENCOUNTER — Encounter: Payer: Self-pay | Admitting: Hematology

## 2021-04-27 ENCOUNTER — Inpatient Hospital Stay: Payer: Medicare Other | Attending: Nurse Practitioner

## 2021-04-27 ENCOUNTER — Other Ambulatory Visit: Payer: Self-pay

## 2021-04-27 ENCOUNTER — Inpatient Hospital Stay: Payer: Medicare Other | Admitting: Hematology

## 2021-04-27 VITALS — BP 126/67 | HR 66 | Temp 98.1°F | Resp 18 | Ht 68.0 in | Wt 163.4 lb

## 2021-04-27 DIAGNOSIS — E039 Hypothyroidism, unspecified: Secondary | ICD-10-CM | POA: Diagnosis not present

## 2021-04-27 DIAGNOSIS — Z170 Estrogen receptor positive status [ER+]: Secondary | ICD-10-CM | POA: Insufficient documentation

## 2021-04-27 DIAGNOSIS — C50211 Malignant neoplasm of upper-inner quadrant of right female breast: Secondary | ICD-10-CM

## 2021-04-27 DIAGNOSIS — M810 Age-related osteoporosis without current pathological fracture: Secondary | ICD-10-CM | POA: Diagnosis not present

## 2021-04-27 DIAGNOSIS — Z79810 Long term (current) use of selective estrogen receptor modulators (SERMs): Secondary | ICD-10-CM | POA: Insufficient documentation

## 2021-04-27 LAB — CMP (CANCER CENTER ONLY)
ALT: 8 U/L (ref 0–44)
AST: 22 U/L (ref 15–41)
Albumin: 4.1 g/dL (ref 3.5–5.0)
Alkaline Phosphatase: 25 U/L — ABNORMAL LOW (ref 38–126)
Anion gap: 5 (ref 5–15)
BUN: 12 mg/dL (ref 8–23)
CO2: 28 mmol/L (ref 22–32)
Calcium: 9.2 mg/dL (ref 8.9–10.3)
Chloride: 106 mmol/L (ref 98–111)
Creatinine: 0.89 mg/dL (ref 0.44–1.00)
GFR, Estimated: >60 mL/min (ref >60)
Glucose, Bld: 84 mg/dL (ref 70–99)
Potassium: 3.7 mmol/L (ref 3.5–5.1)
Sodium: 139 mmol/L (ref 135–145)
Total Bilirubin: 0.5 mg/dL (ref 0.3–1.2)
Total Protein: 6.8 g/dL (ref 6.5–8.1)

## 2021-04-27 LAB — CBC WITH DIFFERENTIAL (CANCER CENTER ONLY)
Abs Immature Granulocytes: 0.01 10*3/uL (ref 0.00–0.07)
Basophils Absolute: 0.1 10*3/uL (ref 0.0–0.1)
Basophils Relative: 1 %
Eosinophils Absolute: 0.1 10*3/uL (ref 0.0–0.5)
Eosinophils Relative: 2 %
HCT: 40.5 % (ref 36.0–46.0)
Hemoglobin: 13.7 g/dL (ref 12.0–15.0)
Immature Granulocytes: 0 %
Lymphocytes Relative: 44 %
Lymphs Abs: 3.1 10*3/uL (ref 0.7–4.0)
MCH: 32.9 pg (ref 26.0–34.0)
MCHC: 33.8 g/dL (ref 30.0–36.0)
MCV: 97.1 fL (ref 80.0–100.0)
Monocytes Absolute: 0.7 10*3/uL (ref 0.1–1.0)
Monocytes Relative: 10 %
Neutro Abs: 3.1 10*3/uL (ref 1.7–7.7)
Neutrophils Relative %: 43 %
Platelet Count: 283 10*3/uL (ref 150–400)
RBC: 4.17 MIL/uL (ref 3.87–5.11)
RDW: 12.5 % (ref 11.5–15.5)
WBC Count: 7.1 10*3/uL (ref 4.0–10.5)
nRBC: 0 % (ref 0.0–0.2)

## 2021-04-27 MED ORDER — TAMOXIFEN CITRATE 20 MG PO TABS: 20.0000 mg | ORAL_TABLET | Freq: Every day | ORAL | 3 refills | Status: DC

## 2021-04-27 NOTE — Progress Notes (Signed)
[name]   [phone] Fax:([phone]   Clinic Follow up Note   Patient Care Team: Kristen Loader, FNP as PCP - General (Family Medicine) Rockwell Germany, RN as Oncology Nurse Navigator Mauro Kaufmann, RN as Oncology Nurse Navigator Stark Klein, MD as Consulting Physician (General Surgery) Truitt Merle, MD as Consulting Physician (Hematology) Eppie Gibson, MD as Attending Physician (Radiation Oncology) Alla Feeling, NP as Nurse Practitioner (Nurse Practitioner)  Date of Service:  04/27/2021  CHIEF COMPLAINT: f/u of right breast cancer  CURRENT THERAPY:  Tamoxifen, started 10/2020  ASSESSMENT & PLAN:  Rachael [facility] is a 71 y.o. female with   1. Malignant neoplasm of upper-inner quadrant of right breast, Stage IA, pT1bN0M0, ER+/PR+/HER2-, Grade I -found on screening mammogram. Biopsy on 08/23/20 showed invasive ductal carcinoma. S/p lumpectomy 09/29/20 by Dr. Barry Dienes showed 0.9 cm IDC with low-grade DCIS. -given recent LUMINA trial data, we felt comfortable for her to skip adjuvant radiation. -she started tamoxifen in 10/2020 due to her known osteoporosis and arthritis. She is tolerating well overall with only mild joint stiffness. -next mammogram due 07/2021; I ordered today. -she is clinically doing well. Labs reviewed, no concern. Physical exam was unremarkable. There is no clinical concern for recurrence.   2.  Osteoporosis -DEXA from 01/2019 showed osteoporosis at AP spine (T-score -3.4). Repeat DEXA on 04/24/21 showed some improvement to -3.1 at AP spine. Will plan to repeat every 2 years. -we previously discussed tamoxifen will strengthen her bones. -she declined Zometa or oral biphosphatase.  -I encourage her to continue calcium/vitD and weight bearing exercise      Plan -continue tamoxifen, I refilled today -mammogram due 07/2021, ordered  -Lab and follow-up with NP Lacie in 6 months   No problem-specific Assessment & Plan notes  found for this encounter.   SUMMARY OF ONCOLOGIC HISTORY: Oncology History Overview Note  Cancer Staging Malignant neoplasm of upper-inner quadrant of right breast in female, estrogen receptor positive ([place]) Staging form: Breast, AJCC 8th Edition - Clinical stage from 08/23/2020: Stage IA (cT1b, cN0, cM0, G1, ER+, PR+, HER2-) - Signed by Truitt Merle, MD on 08/30/2020 Histologic grading system: 3 grade system - Pathologic stage from 09/29/2020: Stage Unknown (pT1b, pNX, cM0, G1, ER+, PR+, HER2-) - Signed by Truitt Merle, MD on 10/15/2020 Stage prefix: Initial diagnosis Histologic grading system: 3 grade system Residual tumor (R): R0 - None    Malignant neoplasm of upper-inner quadrant of right breast in female, estrogen receptor positive ([place])  08/15/2020 Mammogram   an irregular hypoechoic mass in the RIGHT breast at the 2 o'clock axis, 6 cm from the nipple, measuring 8 x 5 mm, with internal vascularity, with associated architectural distortion, corresponding to the mammographic finding.   There is questionable intraductal extension peripheral to the mass, as manifested by mild ductal prominence up to 2 cm from the mass.   RIGHT axilla was evaluated with ultrasound showing no enlarged or morphologically abnormal lymph nodes   08/23/2020 Initial Biopsy   Diagnosis Breast, right, needle core biopsy, 2 o'clock - INVASIVE DUCTAL CARCINOMA - CALCIFICATIONS - SEE COMMENT  Microscopic Comment Based on the biopsy, the carcinoma appears Nottingham grade 1 of 3 and measures 0.4 cm in greatest linear extent. Prognostic markers (ER/PR/ki-67/HER2) are pending and will be reported in an addendum. Dr. Saralyn Pilar reviewed the case and agrees with the above diagnosis. These results were called to [facility] on August 24, 2020.    08/23/2020 Receptors her2  PROGNOSTIC INDICATORS Results: IMMUNOHISTOCHEMICAL AND MORPHOMETRIC ANALYSIS PERFORMED MANUALLY The tumor cells are EQUIVOCAL for  Her2 (2+). Her2 by FISH will be performed and results reported separately. Estrogen Receptor: 100%, POSITIVE, STRONG STAINING INTENSITY Progesterone Receptor: 90%, POSITIVE, STRONG STAINING INTENSITY Proliferation Marker Ki67: 1%   FLUORESCENCE IN-SITU HYBRIDIZATION Results: GROUP 5: HER2 **NEGATIVE** Equivocal form of amplification of the HER2 gene was detected in the IHC 2+ tissue sample received from this individual. HER2 FISH was performed by a technologist and cell imaging and analysis on the BioView.   08/23/2020 Cancer Staging   Staging form: Breast, AJCC 8th Edition - Clinical stage from 08/23/2020: Stage IA (cT1b, cN0, cM0, G1, ER+, PR+, HER2-) - Signed by Truitt Merle, MD on 08/30/2020 Histologic grading system: 3 grade system    08/25/2020 Initial Diagnosis   Malignant neoplasm of upper-inner quadrant of right breast in female, estrogen receptor positive ([place])   09/29/2020 Cancer Staging   Staging form: Breast, AJCC 8th Edition - Pathologic stage from 09/29/2020: Stage Unknown (pT1b, pNX, cM0, G1, ER+, PR+, HER2-) - Signed by Truitt Merle, MD on 10/15/2020 Stage prefix: Initial diagnosis Histologic grading system: 3 grade system Residual tumor (R): R0 - None    09/29/2020 Definitive Surgery   FINAL MICROSCOPIC DIAGNOSIS:   A. BREAST, RIGHT, LUMPECTOMY:  -  Invasive ductal carcinoma, Nottingham grade 1 of 3, 0.9 cm  -  Ductal carcinoma in-situ, low-grade  -  Margins uninvolved by carcinoma (0.6 cm; anterior margin)  -  Previous biopsy site changes present  -  See oncology table and comment below   B. BREAST, RIGHT ADDITIONAL POSTERIOR MARGIN, EXCISION:  -  Focal fibrocystic changes with apocrine metaplasia  -  No residual carcinoma identified       INTERVAL HISTORY:  [name] is here for a follow up of breast cancer. She was last seen by me on 10/14/20 with survivorship in the interim. She presents to the clinic accompanied by her husband. She reports she is  tolerating the tamoxifen well; she notes she had hot flashes in the beginning that have diminished. She also notes a slight weight gain (~4-5 lbs).   All other systems were reviewed with the patient and are negative.  MEDICAL HISTORY:  Past Medical History:  Diagnosis Date   Breast cancer ([place])    Hyperlipidemia with target low density lipoprotein (LDL) cholesterol less than 100 mg/dL    Hypertension    Hypothyroidism 2009   Thyroid disease     SURGICAL HISTORY: Past Surgical History:  Procedure Laterality Date   BREAST LUMPECTOMY WITH RADIOACTIVE SEED LOCALIZATION Right 09/29/2020   Procedure: RIGHT BREAST LUMPECTOMY WITH RADIOACTIVE SEED LOCALIZATION;  Surgeon: Stark Klein, MD;  Location: [facility];  Service: General;  Laterality: Right;   right knee surgery     TONSILLECTOMY      I have reviewed the social history and family history with the patient and they are unchanged from previous note.  ALLERGIES:  is allergic to cetylpyridinium.  MEDICATIONS:  Current Outpatient Medications  Medication Sig Dispense Refill   acetaminophen (TYLENOL) 500 MG tablet Take 500-1,000 mg by mouth every 6 (six) hours as needed (pain).     Calcium Carb-Cholecalciferol (CALCIUM 500 + D3) 500-600 MG-UNIT TABS Take 1 tablet by mouth daily.     Coenzyme Q10 (COQ10 PO) Take 1 capsule by mouth daily at 12 noon. midday     levothyroxine (SYNTHROID) 50 MCG tablet Take 50 mcg by mouth daily before breakfast.  Magnesium 250 MG TABS Take 1 tablet by mouth daily.     Multiple Vitamins-Minerals (IMMUNE SUPPORT PO) Take 1 tablet by mouth daily at 12 noon. midday     Multiple Vitamins-Minerals (MULTIVITAMIN ADULTS 50+) TABS Take 1 tablet by mouth daily.     pravastatin (PRAVACHOL) 40 MG tablet Take 40 mg by mouth at bedtime.     tamoxifen (NOLVADEX) 20 MG tablet Take 1 tablet (20 mg total) by mouth daily. 90 tablet 3   No current facility-administered medications for this visit.    PHYSICAL  EXAMINATION: ECOG PERFORMANCE STATUS: 0 - Asymptomatic  Vitals:   04/27/21 1124  BP: 126/67  Pulse: 66  Resp: 18  Temp: 98.1 F (36.7 C)  SpO2: 100%   Wt Readings from Last 3 Encounters:  04/27/21 163 lb 6.4 oz (74.1 kg)  01/03/21 162 lb (73.5 kg)  10/14/20 159 lb 3.2 oz (72.2 kg)     GENERAL:alert, no distress and comfortable SKIN: skin color, texture, turgor are normal, no rashes or significant lesions EYES: normal, Conjunctiva are pink and non-injected, sclera clear  NECK: supple, thyroid normal size, non-tender, without nodularity LYMPH:  no palpable lymphadenopathy in the cervical, axillary  LUNGS: clear to auscultation and percussion with normal breathing effort HEART: regular rate & rhythm and no murmurs and no lower extremity edema ABDOMEN:abdomen soft, non-tender and normal bowel sounds Musculoskeletal:no cyanosis of digits and no clubbing  NEURO: alert & oriented x 3 with fluent speech, no focal motor/sensory deficits BREAST: No palpable mass, nodules or adenopathy bilaterally. Breast exam benign.   LABORATORY DATA:  I have reviewed the data as listed CBC Latest Ref Rng & Units 04/27/2021 09/21/2020 08/31/2020  WBC 4.0 - 10.5 K/uL 7.1 7.4 5.7  Hemoglobin 12.0 - 15.0 g/dL 13.7 14.8 14.6  Hematocrit 36.0 - 46.0 % 40.5 44.0 42.6  Platelets 150 - 400 K/uL 283 328 287     CMP Latest Ref Rng & Units 04/27/2021 09/21/2020 08/31/2020  Glucose 70 - 99 mg/dL 84 91 95  BUN 8 - 23 mg/dL '12 8 13  ' Creatinine 0.44 - 1.00 mg/dL 0.89 0.75 0.89  Sodium 135 - 145 mmol/L 139 138 142  Potassium 3.5 - 5.1 mmol/L 3.7 3.7 4.6  Chloride 98 - 111 mmol/L 106 107 106  CO2 22 - 32 mmol/L '28 26 26  ' Calcium 8.9 - 10.3 mg/dL 9.2 9.4 10.0  Total Protein 6.5 - 8.1 g/dL 6.8 - 7.2  Total Bilirubin 0.3 - 1.2 mg/dL 0.5 - 0.8  Alkaline Phos 38 - 126 U/L 25(L) - 43  AST 15 - 41 U/L 22 - 27  ALT 0 - 44 U/L 8 - 8      RADIOGRAPHIC STUDIES: I have personally reviewed the radiological images as listed  and agreed with the findings in the report. No results found.    Orders Placed This Encounter  Procedures   MM DIAG BREAST TOMO BILATERAL    Standing Status:   Future    Standing Expiration Date:   04/27/2022    Order Specific Question:   Reason for Exam (SYMPTOM  OR DIAGNOSIS REQUIRED)    Answer:   screening    Order Specific Question:   Preferred imaging location?    Answer:   [facility]   All questions were answered. The patient knows to call the clinic with any problems, questions or concerns. No barriers to learning was detected. The total time spent in the appointment was 30 minutes.  Truitt Merle, MD 04/27/2021   I, Wilburn Mylar, am acting as scribe for Truitt Merle, MD.   I have reviewed the above documentation for accuracy and completeness, and I agree with the above.

## 2021-05-12 ENCOUNTER — Encounter: Payer: Self-pay | Admitting: Hematology

## 2021-05-12 DIAGNOSIS — E782 Mixed hyperlipidemia: Secondary | ICD-10-CM | POA: Diagnosis not present

## 2021-05-12 DIAGNOSIS — Z0000 Encounter for general adult medical examination without abnormal findings: Secondary | ICD-10-CM | POA: Diagnosis not present

## 2021-05-12 DIAGNOSIS — M810 Age-related osteoporosis without current pathological fracture: Secondary | ICD-10-CM | POA: Diagnosis not present

## 2021-05-12 DIAGNOSIS — C50911 Malignant neoplasm of unspecified site of right female breast: Secondary | ICD-10-CM | POA: Diagnosis not present

## 2021-05-12 DIAGNOSIS — E039 Hypothyroidism, unspecified: Secondary | ICD-10-CM | POA: Diagnosis not present

## 2021-07-04 ENCOUNTER — Encounter: Payer: Self-pay | Admitting: Physician Assistant

## 2021-07-04 ENCOUNTER — Encounter (HOSPITAL_COMMUNITY): Payer: Self-pay

## 2021-07-04 ENCOUNTER — Ambulatory Visit: Payer: Medicare Other | Admitting: Physician Assistant

## 2021-07-04 DIAGNOSIS — Z1283 Encounter for screening for malignant neoplasm of skin: Secondary | ICD-10-CM

## 2021-07-04 DIAGNOSIS — D485 Neoplasm of uncertain behavior of skin: Secondary | ICD-10-CM

## 2021-07-04 DIAGNOSIS — D040 Carcinoma in situ of skin of lip: Secondary | ICD-10-CM | POA: Diagnosis not present

## 2021-07-04 DIAGNOSIS — L570 Actinic keratosis: Secondary | ICD-10-CM | POA: Diagnosis not present

## 2021-07-04 DIAGNOSIS — C4492 Squamous cell carcinoma of skin, unspecified: Secondary | ICD-10-CM

## 2021-07-04 HISTORY — DX: Squamous cell carcinoma of skin, unspecified: C44.92

## 2021-07-04 NOTE — Patient Instructions (Signed)
Biopsy, Surgery (Curettage) & Surgery (Excision) Aftercare Instructions ? ?1. Okay to remove bandage in 24 hours ? ?2. Wash area with soap and water ? ?3. Apply Vaseline to area twice daily until healed (Not Neosporin) ? ?4. Okay to cover with a Band-Aid to decrease the chance of infection or prevent irritation from clothing; also it's okay to uncover lesion at home. ? ?5. Suture instructions: return to our office in 7-10 or 10-14 days for a nurse visit for suture removal. Variable healing with sutures, if pain or itching occurs call our office. It's okay to shower or bathe 24 hours after sutures are given. ? ?6. The following risks may occur after a biopsy, curettage or excision: bleeding, scarring, discoloration, recurrence, infection (redness, yellow drainage, pain or swelling). ? ?7. For questions, concerns and results call our office at [facility] before 4pm & Friday before 3pm. Biopsy results will be available in 1 week. ? ?Actinic Keratosis ?An actinic keratosis is a precancerous growth on the skin. If there is more than one growth, the condition is called actinic keratoses. Actinic keratoses appear most often on areas of skin that get a lot of sun exposure, including the scalp, face, ears, lips, upper back, forearms, and the backs of the hands. ?If left untreated, these growths may develop into a skin cancer called squamous cell carcinoma. It is important to have all these growths checked by a health care provider to determine the best treatment approach. ?What are the causes? ?Actinic keratoses are caused by getting too much ultraviolet (UV) radiation from the sun or other UV light sources. ?What increases the risk? ?You are more likely to develop this condition if you: ?Have light-colored skin and blue eyes. ?Have blond or red hair. ?Spend a lot of time in the sun. ?Do not protect your skin from the sun when outdoors. ?Are an older person. The risk of developing an actinic keratosis increases with  age. ?What are the signs or symptoms? ?Actinic keratoses feel like scaly, rough spots of skin. Symptoms of this condition include growths that may: ?Be as small as a pinhead or as big as a quarter. ?Itch, hurt, or feel sensitive. ?Be skin-colored, light tan, dark tan, pink, or a combination of any of these colors. In most cases, the growths become red. ?Have a small piece of pink or gray skin (skin tag) growing from them. ?It may be easier to notice actinic keratoses by feeling them, rather than seeing them. Sometimes, actinic keratoses disappear, but many reappear a few days to a few weeks later. ?How is this diagnosed? ?This condition is usually diagnosed with a physical exam. ?A tissue sample may be removed from the actinic keratosis and examined under a microscope (biopsy). ?How is this treated? ?If needed, this condition may be treated by: ?Scraping off the actinic keratosis (curettage). ?Freezing the actinic keratosis with liquid nitrogen (cryosurgery). This causes the growth to eventually fall off the skin. ?Applying medicated creams or gels to destroy the cells in the growth. ?Applying chemicals to the actinic keratosis to make the outer layers of skin peel off (chemical peel). ?Using photodynamic therapy. In this procedure, medicated cream is applied to the actinic keratosis. This cream increases your skin's sensitivity to light. Then, a strong light is aimed at the actinic keratosis to destroy cells in the growth. ?Follow these instructions at home: ?Skin care ?Apply cool, wet cloths (cool compresses) to the affected areas. ?Do not scratch your skin. ?Check your skin regularly for any growths, especially growths  that: ?Start to itch or bleed. ?Change in size, shape, or color. ?Caring for the treated area ?Keep the treated area clean and dry as told by your health care provider. ?Do not apply any medicine, cream, or lotion to the treated area unless your health care provider tells you to do that. ?Do not  pick at blisters or try to break them open. This can cause infection and scarring. ?If you have red or irritated skin after treatment, follow instructions from your health care provider about how to take care of the treated area. Make sure you: ?Wash your hands with soap and water before you change your bandage (dressing). If soap and water are not available, use hand sanitizer. ?Change your dressing as told by your health care provider. ?If you have red or irritated skin after treatment, check your treated area every day for signs of infection. Check for: ?Redness, swelling, or pain. ?Fluid or blood. ?Warmth. ?Pus or a bad smell. ?General instructions ?Take or apply over-the-counter and prescription medicines only as told by your health care provider. ?Return to your normal activities as told by your health care provider. Ask your health care provider what activities are safe for you. ?Have a skin exam done every year by a health care provider who is a skin specialist (dermatologist). ?Keep all follow-up visits as told by your health care provider. This is important. ?Lifestyle ?Do not use any products that contain nicotine or tobacco, such as cigarettes and e-cigarettes. If you need help quitting, ask your health care provider. ?Take steps to protect your skin from the sun. ?Try to avoid the sun between 10:00 a.m. and 4:00 p.m. This is when the UV light is the strongest. ?Use a sunscreen or sunblock with SPF 30 (sun protection factor 30) or greater. ?Apply sunscreen before you are exposed to sunlight and reapply as often as directed by the instructions on the sunscreen container. ?Always wear sunglasses that have UV protection, and always wear a hat and clothing to protect your skin from sunlight. ?When possible, avoid medicines that increase your sensitivity to sunlight. ?Do not use tanning beds or other indoor tanning devices. ?Contact a health care provider if: ?You notice any changes or new growths on your  skin. ?You have swelling, pain, or more redness around your treated area. ?You have fluid or blood coming from your treated area. ?Your treated area feels warm to the touch. ?You have pus or a bad smell coming from your treated area. ?You have a fever. ?You have a blister that becomes large and painful. ?Summary ?An actinic keratosis is a precancerous growth on the skin. If there is more than one growth, the condition is called actinic keratoses. In some cases, if left untreated, these growths can develop into skin cancer. ?Check your skin regularly for any growths, especially growths that start to itch or bleed, or change in size, shape, or color. ?Take steps to protect your skin from the sun. ?Contact a health care provider if you notice any changes or new growths on your skin. ?Keep all follow-up visits as told by your health care provider. This is important. ?This information is not intended to replace advice given to you by your health care provider. Make sure you discuss any questions you have with your health care provider. ?Document Revised: 07/23/2017 Document Reviewed: 07/23/2017 ?Elsevier Patient Education ? [place]. ? ?

## 2021-07-10 ENCOUNTER — Encounter: Payer: Self-pay | Admitting: Physician Assistant

## 2021-07-10 NOTE — Progress Notes (Signed)
? ?  New Patient ?  ?Subjective  ?Rachael Moore is a 71 y.o. female who presents for the following: New Patient (Initial Visit) (No new concerns- NO family or personal history melanoma or non mole skin cancers. ). ? ? ?The following portions of the chart were reviewed this encounter and updated as appropriate:  Tobacco  Allergies  Meds  Problems  Med Hx  Surg Hx  Fam Hx   ?  ? ?Objective  ?Well appearing patient in no apparent distress; mood and affect are within normal limits. ? ?A full examination was performed including scalp, head, eyes, ears, nose, lips, neck, chest, axillae, abdomen, back, buttocks, bilateral upper extremities, bilateral lower extremities, hands, feet, fingers, toes, fingernails, and toenails. All findings within normal limits unless otherwise noted below. ? ?Right Upper Vermilion Lip ?Hyperkeratotic scale with pink base  ? ? ? ? ? ? ?Left Nasal Sidewall, Right Eyebrow, Right Nasal Sidewall (2) ?Erythematous patches with gritty scale. ? ? ?Assessment & Plan  ?Neoplasm of uncertain behavior of skin ?Right Upper Vermilion Lip ? ?Skin / nail biopsy ?Type of biopsy: tangential   ?Informed consent: discussed and consent obtained   ?Timeout: patient name, date of birth, surgical site, and procedure verified   ?Procedure prep:  Patient was prepped and draped in usual sterile fashion (Non sterile) ?Prep type:  Chlorhexidine ?Anesthesia: the lesion was anesthetized in a standard fashion   ?Anesthetic:  1% lidocaine w/ epinephrine 1-100,000 local infiltration ?Instrument used: flexible razor blade   ?Hemostasis achieved with: aluminum chloride and electrodesiccation   ?Outcome: patient tolerated procedure well   ?Post-procedure details: sterile dressing applied and wound care instructions given   ?Dressing type: bandage and petrolatum   ? ?Specimen 1 - Surgical pathology ?Differential Diagnosis: R/O BCC vs SCC - cautery after biopsy ? ?Check Margins: Yes ? ?AK (actinic keratosis) (4) ?Right  Eyebrow; Left Nasal Sidewall; Right Nasal Sidewall (2) ? ?Return in 6 months for possible [place] treatment.  ? ?Destruction of lesion - Left Nasal Sidewall, Right Eyebrow, Right Nasal Sidewall ?Complexity: simple   ?Destruction method: cryotherapy   ?Informed consent: discussed and consent obtained   ?Timeout:  patient name, date of birth, surgical site, and procedure verified ?Lesion destroyed using liquid nitrogen: Yes   ?Cryotherapy cycles:  1 ?Outcome: patient tolerated procedure well with no complications   ?Post-procedure details: wound care instructions given   ? ? ? ? ?I, Emilyann Banka, PA-C, have reviewed all documentation's for this visit.  The documentation on 07/10/21 for the exam, diagnosis, procedures and orders are all accurate and complete. ?

## 2021-07-11 ENCOUNTER — Encounter: Payer: Self-pay | Admitting: *Deleted

## 2021-07-11 ENCOUNTER — Telehealth: Payer: Self-pay | Admitting: *Deleted

## 2021-07-11 NOTE — Telephone Encounter (Signed)
Path to patient. Made surgical appointment with Vida Roller.  ?

## 2021-07-11 NOTE — Telephone Encounter (Signed)
-----   Message from Warren Danes, [place] sent at 07/11/2021  8:06 AM EDT ----- ?30 ?

## 2021-07-12 ENCOUNTER — Encounter: Payer: Self-pay | Admitting: Physician Assistant

## 2021-07-17 ENCOUNTER — Telehealth: Payer: Self-pay | Admitting: Physician Assistant

## 2021-07-17 NOTE — Telephone Encounter (Signed)
Left message for patient to call back  

## 2021-07-17 NOTE — Telephone Encounter (Signed)
Patient wanted to get a copy of her results since she can not see them Via my chart. Mailed out results. Patient has a appointment with [facility] scheduled.  ?

## 2021-07-17 NOTE — Telephone Encounter (Signed)
Patient left message on office voice mail that she was calling for pathology results from last visit with Kelli Sheffield, PA-C. 

## 2021-07-17 NOTE — Telephone Encounter (Signed)
Patient is returning call about results. ?

## 2021-08-03 ENCOUNTER — Other Ambulatory Visit: Payer: Self-pay | Admitting: Hematology

## 2021-08-03 ENCOUNTER — Ambulatory Visit
Admission: RE | Admit: 2021-08-03 | Discharge: 2021-08-03 | Disposition: A | Discharge status: Home / Self Care | Payer: Medicare Other | Source: Ambulatory Visit | Attending: Hematology | Admitting: Hematology

## 2021-08-03 DIAGNOSIS — R928 Other abnormal and inconclusive findings on diagnostic imaging of breast: Secondary | ICD-10-CM

## 2021-08-03 DIAGNOSIS — N6042 Mammary duct ectasia of left breast: Secondary | ICD-10-CM | POA: Diagnosis not present

## 2021-08-03 DIAGNOSIS — Z170 Estrogen receptor positive status [ER+]: Secondary | ICD-10-CM

## 2021-08-03 DIAGNOSIS — Z853 Personal history of malignant neoplasm of breast: Secondary | ICD-10-CM | POA: Diagnosis not present

## 2021-08-17 ENCOUNTER — Encounter: Payer: Self-pay | Admitting: Nurse Practitioner

## 2021-08-23 ENCOUNTER — Encounter: Payer: Self-pay | Admitting: Physician Assistant

## 2021-08-23 ENCOUNTER — Ambulatory Visit (INDEPENDENT_AMBULATORY_CARE_PROVIDER_SITE_OTHER): Payer: Medicare Other | Admitting: Physician Assistant

## 2021-08-23 DIAGNOSIS — D040 Carcinoma in situ of skin of lip: Secondary | ICD-10-CM

## 2021-08-23 DIAGNOSIS — L570 Actinic keratosis: Secondary | ICD-10-CM

## 2021-08-23 MED ORDER — TOLAK 4 % EX CREA: TOPICAL_CREAM | CUTANEOUS | 0 refills | Status: DC

## 2021-08-23 NOTE — Progress Notes (Signed)
   Follow-Up Visit   Subjective  Rachael Moore is a 71 y.o. female who presents for the following: Procedure (Here for treatment- right upper [place] lip- scc x 1). She has numerous small crusts on her face from sun damage.    The following portions of the chart were reviewed this encounter and updated as appropriate:  Tobacco  Allergies  Meds  Problems  Med Hx  Surg Hx  Fam Hx      Objective  Well appearing patient in no apparent distress; mood and affect are within normal limits.  All skin of the face was examined.  Head - Anterior (Face) Erythematous patches with gritty scale.  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  Destruction of lesion Complexity: simple   Destruction method: electrodesiccation and curettage   Informed consent: discussed and consent obtained   Timeout:  patient name, date of birth, surgical site, and procedure verified Anesthesia: the lesion was anesthetized in a standard fashion   Anesthetic:  1% lidocaine w/ epinephrine 1-100,000 local infiltration Curettage performed in three different directions: Yes   Electrodesiccation performed over the curetted area: Yes   Curettage cycles:  3 Margin per side (cm):  0.1 Final wound size (cm):  1 Hemostasis achieved with:  aluminum chloride Outcome: patient tolerated procedure well with no complications   Post-procedure details: wound care instructions given      I, Silus Lanzo, PA-C, have reviewed all documentation's for this visit.  The documentation on 08/23/21 for the exam, diagnosis, procedures and orders are all accurate and complete.

## 2021-08-23 NOTE — Patient Instructions (Signed)

## 2021-08-30 DIAGNOSIS — S99921A Unspecified injury of right foot, initial encounter: Secondary | ICD-10-CM | POA: Diagnosis not present

## 2021-08-30 DIAGNOSIS — M21961 Unspecified acquired deformity of right lower leg: Secondary | ICD-10-CM | POA: Diagnosis not present

## 2021-08-30 DIAGNOSIS — M792 Neuralgia and neuritis, unspecified: Secondary | ICD-10-CM | POA: Diagnosis not present

## 2021-09-19 ENCOUNTER — Other Ambulatory Visit: Payer: Self-pay

## 2021-09-19 DIAGNOSIS — Z170 Estrogen receptor positive status [ER+]: Secondary | ICD-10-CM

## 2021-09-19 NOTE — Progress Notes (Signed)
[place]   [phone] [phone]   Clinic Follow up Note   Patient Care Team: Kristen Loader, FNP as PCP - General (Family Medicine) Rockwell Germany, RN as Oncology Nurse Navigator Mauro Kaufmann, RN as Oncology Nurse Navigator Stark Klein, MD as Consulting Physician (General Surgery) Truitt Merle, MD as Consulting Physician (Hematology) Eppie Gibson, MD as Attending Physician (Radiation Oncology) Alla Feeling, NP as Nurse Practitioner (Nurse Practitioner) Starlyn Skeans as Physician Assistant (Dermatology) 09/21/2021  CHIEF COMPLAINT: Follow-up right breast cancer  SUMMARY OF ONCOLOGIC HISTORY: Oncology History Overview Note  Cancer Staging Malignant neoplasm of upper-inner quadrant of right breast in female, estrogen receptor positive ([place]) Staging form: Breast, AJCC 8th Edition - Clinical stage from 08/23/2020: Stage IA (cT1b, cN0, cM0, G1, ER+, PR+, HER2-) - Signed by Truitt Merle, MD on 08/30/2020 Histologic grading system: 3 grade system - Pathologic stage from 09/29/2020: Stage Unknown (pT1b, pNX, cM0, G1, ER+, PR+, HER2-) - Signed by Truitt Merle, MD on 10/15/2020 Stage prefix: Initial diagnosis Histologic grading system: 3 grade system Residual tumor (R): R0 - None    Malignant neoplasm of upper-inner quadrant of right breast in female, estrogen receptor positive ([place])  08/15/2020 Mammogram   an irregular hypoechoic mass in the RIGHT breast at the 2 o'clock axis, 6 cm from the nipple, measuring 8 x 5 mm, with internal vascularity, with associated architectural distortion, corresponding to the mammographic finding.   There is questionable intraductal extension peripheral to the mass, as manifested by mild ductal prominence up to 2 cm from the mass.   RIGHT axilla was evaluated with ultrasound showing no enlarged or morphologically abnormal lymph nodes   08/23/2020 Initial Biopsy   Diagnosis Breast, right, needle core  biopsy, 2 o'clock - INVASIVE DUCTAL CARCINOMA - CALCIFICATIONS - SEE COMMENT  Microscopic Comment Based on the biopsy, the carcinoma appears Nottingham grade 1 of 3 and measures 0.4 cm in greatest linear extent. Prognostic markers (ER/PR/ki-67/HER2) are pending and will be reported in an addendum. Dr. Saralyn Pilar reviewed the case and agrees with the above diagnosis. These results were called to [facility] on August 24, 2020.    08/23/2020 Receptors her2   PROGNOSTIC INDICATORS Results: IMMUNOHISTOCHEMICAL AND MORPHOMETRIC ANALYSIS PERFORMED MANUALLY The tumor cells are EQUIVOCAL for Her2 (2+). Her2 by FISH will be performed and results reported separately. Estrogen Receptor: 100%, POSITIVE, STRONG STAINING INTENSITY Progesterone Receptor: 90%, POSITIVE, STRONG STAINING INTENSITY Proliferation Marker Ki67: 1%   FLUORESCENCE IN-SITU HYBRIDIZATION Results: GROUP 5: HER2 **NEGATIVE** Equivocal form of amplification of the HER2 gene was detected in the IHC 2+ tissue sample received from this individual. HER2 FISH was performed by a technologist and cell imaging and analysis on the BioView.   08/23/2020 Cancer Staging   Staging form: Breast, AJCC 8th Edition - Clinical stage from 08/23/2020: Stage IA (cT1b, cN0, cM0, G1, ER+, PR+, HER2-) - Signed by Truitt Merle, MD on 08/30/2020 Histologic grading system: 3 grade system   08/25/2020 Initial Diagnosis   Malignant neoplasm of upper-inner quadrant of right breast in female, estrogen receptor positive ([place])   09/29/2020 Cancer Staging   Staging form: Breast, AJCC 8th Edition - Pathologic stage from 09/29/2020: Stage Unknown (pT1b, pNX, cM0, G1, ER+, PR+, HER2-) - Signed by Truitt Merle, MD on 10/15/2020 Stage prefix: Initial diagnosis Histologic grading system: 3 grade system Residual tumor (R): R0 - None   09/29/2020 Definitive Surgery   FINAL MICROSCOPIC DIAGNOSIS:   A. BREAST,  RIGHT, LUMPECTOMY:  -  Invasive ductal carcinoma,  Nottingham grade 1 of 3, 0.9 cm  -  Ductal carcinoma in-situ, low-grade  -  Margins uninvolved by carcinoma (0.6 cm; anterior margin)  -  Previous biopsy site changes present  -  See oncology table and comment below   B. BREAST, RIGHT ADDITIONAL POSTERIOR MARGIN, EXCISION:  -  Focal fibrocystic changes with apocrine metaplasia  -  No residual carcinoma identified      CURRENT THERAPY: Tamoxifen, started 10/2020  INTERVAL HISTORY: Rachael Moore returns for follow-up as scheduled, last seen by Dr. Burr Medico 04/27/2021.  Mammogram/ultrasound 08/03/2021 showed right breast postop changes and dilated duct in the left breast, overall benign.  She continues tamoxifen, tolerating well.  She has muscle cramps in her heel, arch of foot, and ankle mainly at night.  Denies significant bone or joint pain.  She walks 2 miles per day and remains very active.  The left thumbnail is splitting.  Denies breast concerns such as new lump/mass, nipple discharge or inversion, or skin change.  Denies vaginal bleeding, has not had a pelvic exam since starting tamoxifen.  She is followed by [facility] for [facility] in situ of the left lip and actinic keratosis, prescribed 2-week course of 5-FU cream which she plans to complete this fall, next Derm follow-up 1/24.  All other systems were reviewed with the patient and are negative.  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  ALLERGIES:  is allergic to cetylpyridinium.  MEDICATIONS:  Current Outpatient Medications  Medication Sig Dispense Refill   acetaminophen (TYLENOL) 500 MG tablet Take 500-1,000 mg by mouth every 6 (six) hours as needed (pain).     Calcium Carb-Cholecalciferol (CALCIUM 500 + D3) 500-600 MG-UNIT TABS Take 1 tablet by mouth daily.     Coenzyme Q10 (COQ10 PO) Take 1 capsule by mouth daily at 12 noon. midday     Fluorouracil (TOLAK) 4 % CREA Apply topical nightly for 14 days to face 40 g 0   levothyroxine (SYNTHROID) 50 MCG tablet Take 50 mcg by mouth daily before breakfast.     Magnesium 250 MG TABS Take 1 tablet by mouth daily.     Multiple Vitamins-Minerals (IMMUNE SUPPORT PO) Take 1 tablet by mouth daily at 12 noon. midday     Multiple Vitamins-Minerals (MULTIVITAMIN ADULTS 50+) TABS Take 1 tablet by mouth daily.     pravastatin (PRAVACHOL) 40 MG tablet Take 40 mg by mouth at bedtime.     tamoxifen (NOLVADEX) 20 MG tablet Take 1 tablet (20 mg total) by mouth daily. 90 tablet 3   No current facility-administered medications for this visit.    PHYSICAL EXAMINATION: ECOG PERFORMANCE STATUS: 0 - Asymptomatic  Vitals:   09/21/21 0930  BP: 135/71  Pulse: 64  Resp: 15  Temp: 98 F (36.7 C)  SpO2: 100%   Filed Weights   09/21/21 0930  Weight: 161 lb 9.6 oz (73.3 kg)    GENERAL:alert, no distress and comfortable SKIN: No rash EYES: sclera clear NECK: Without mass LYMPH:  no palpable cervical or supraclavicular lymphadenopathy  LUNGS: clear with normal breathing effort HEART: regular rate & rhythm, no lower extremity edema ABDOMEN:abdomen soft, non-tender and normal bowel sounds NEURO: alert & oriented x 3 with fluent speech, no focal motor/sensory deficits Breast exam: Breasts are symmetrical without  nipple discharge or inversion.  S/p right lumpectomy, incisions completely healed minimal scar tissue.  No palpable mass or nodularity in either breast or axilla that I could appreciate   LABORATORY DATA:  I have reviewed the data as listed    Latest Ref Rng & Units 09/21/2021    9:08 AM 04/27/2021   11:05 AM 09/21/2020   11:06 AM  CBC  WBC 4.0 - 10.5 K/uL 6.6  7.1  7.4   Hemoglobin 12.0 - 15.0 g/dL 13.4  13.7  14.8   Hematocrit 36.0 - 46.0 % 39.7  40.5  44.0   Platelets 150 - 400 K/uL 271  283  328         Latest Ref Rng & Units 09/21/2021    9:08 AM 04/27/2021   11:05 AM 09/21/2020   11:06 AM  CMP  Glucose 70 - 99 mg/dL 93  84  91   BUN 8 - 23 mg/dL '16  12  8   ' Creatinine 0.44 - 1.00 mg/dL 0.94  0.89  0.75   Sodium 135 - 145 mmol/L 139  139  138   Potassium 3.5 - 5.1 mmol/L 4.5  3.7  3.7   Chloride 98 - 111 mmol/L 107  106  107   CO2 22 - 32 mmol/L '28  28  26   ' Calcium 8.9 - 10.3 mg/dL 9.3  9.2  9.4   Total Protein 6.5 - 8.1 g/dL 6.5  6.8    Total Bilirubin 0.3 - 1.2 mg/dL 0.5  0.5    Alkaline Phos 38 - 126 U/L 22  25    AST 15 - 41 U/L 22  22    ALT 0 - 44 U/L 8  8        RADIOGRAPHIC STUDIES: I have personally reviewed the radiological images as listed and agreed with the findings in the report. No results found.   ASSESSMENT & PLAN: Rachael Moore is a 71 y.o. Caucasian female with a history of Hypothyroidism and HTN     1. Malignant neoplasm of upper-inner quadrant of right breast, Stage IA, pT1bN0M0, ER+/PR+/HER2-, Grade I -Diagnosed 08/23/2020, s/p right breast lumpectomy by Dr. Barry Dienes 09/29/2020, path showed 0.9 cm grade 1 invasive ductal cancer.  Surgical margins were widely negative.  Due to her age and early stage disease, sentinel lymph node biopsy was not performed and Oncotype was not recommended -Based on LUMINA trial data, her risk of local recurrence is likely less than 5% in the next 5 years, it was decided to forego adjuvant radiation -Given the strong  ER/PR expression, moderate arthritis, and osteoporosis she proceeded with adjuvant tamoxifen starting 10/2020, for 5-10 years -Ms. Newland is clinically doing well.  Tolerating tamoxifen with mild muscle cramps and brittle nails.  We reviewed symptom management including adequate hydration, and joint support during exercise; she can start a skin/hair/nail vitamin  or biotin. -Breast exam is benign, CBC and CMP are unremarkable.  Reviewed her mammo/ultrasound from 07/2021 was benign.  Overall there is no clinical concern for breast cancer recurrence. -She is 1 year from initial diagnosis and definitive treatment, continue surveillance. -She will see Dr. Barry Dienes in 03/2022, phone visit with me in 8 months for tamoxifen tox check, mammogram/ultrasound in 07/2022, and in person follow-up with [place] in 1 year. -I will send a note to her PCP for pelvic exam at next wellness visit -She knows to call sooner with any new concerns or med side effects.  2.  Osteoporosis -Her bone density scan from November 2020 showed a T score -3.4 at [facility], she has osteoporosis.  She was prescribed Fosamax by her PCP but did not take it due to concern for side effects.  Dr. Burr Medico recommended Zometa, again hesitant due to side effects -She takes calcium, vitamin D, multivitamin, and engages in weightbearing exercise daily  -DEXA 04/24/2021 osteoporosis improved, T score -3.1 -We reviewed the bone strengthening quality of tamoxifen, continue other measures  Plan: -Labs, mammogram/ultrasound reviewed -Continue breast cancer surveillance and tamoxifen -Okay to start biotin or skin/hair/nail vitamin -Okay to proceed with 5-FU cream per dermatology -Continue healthy active lifestyle and age-appropriate health maintenance, will need annual pelvic exam while on tamoxifen starting this year (prefers female provider).  -Follow-up Dr. Barry Dienes 03/2022 -Phone follow-up with me in 8 months for tamoxifen tox check -Next mammo 07/2022 -In  person follow-up with me or Dr. Burr Medico in 1 year   Orders Placed This Encounter  Procedures   MM DIAG BREAST TOMO BILATERAL    Standing Status:   Future    Standing Expiration Date:   09/22/2022    Order Specific Question:   Reason for Exam (SYMPTOM  OR DIAGNOSIS REQUIRED)    Answer:   R breast cancer 07/2020 s/p lumpectomy    Order Specific Question:   Preferred imaging location?    Answer:   [facility]   All questions were answered. The patient knows to call the clinic with any problems, questions or concerns. No barriers to learning was detected. I spent 20 minutes counseling the patient face to face. The total time spent in the appointment was 30 minutes and more than 50% was on counseling and review of test results and coordination of care.     Alla Feeling, NP 09/21/21

## 2021-09-21 ENCOUNTER — Other Ambulatory Visit: Payer: Self-pay

## 2021-09-21 ENCOUNTER — Encounter: Payer: Self-pay | Admitting: Nurse Practitioner

## 2021-09-21 ENCOUNTER — Inpatient Hospital Stay: Payer: Medicare Other | Admitting: Nurse Practitioner

## 2021-09-21 ENCOUNTER — Inpatient Hospital Stay: Payer: Medicare Other | Attending: Nurse Practitioner

## 2021-09-21 VITALS — BP 135/71 | HR 64 | Temp 98.0°F | Resp 15 | Wt 161.6 lb

## 2021-09-21 DIAGNOSIS — C50211 Malignant neoplasm of upper-inner quadrant of right female breast: Secondary | ICD-10-CM

## 2021-09-21 DIAGNOSIS — M810 Age-related osteoporosis without current pathological fracture: Secondary | ICD-10-CM | POA: Diagnosis not present

## 2021-09-21 DIAGNOSIS — Z170 Estrogen receptor positive status [ER+]: Secondary | ICD-10-CM | POA: Insufficient documentation

## 2021-09-21 DIAGNOSIS — Z79810 Long term (current) use of selective estrogen receptor modulators (SERMs): Secondary | ICD-10-CM | POA: Insufficient documentation

## 2021-09-21 LAB — CBC WITH DIFFERENTIAL/PLATELET
Abs Immature Granulocytes: 0.01 10*3/uL (ref 0.00–0.07)
Basophils Absolute: 0.1 10*3/uL (ref 0.0–0.1)
Basophils Relative: 1 %
Eosinophils Absolute: 0.1 10*3/uL (ref 0.0–0.5)
Eosinophils Relative: 2 %
HCT: 39.7 % (ref 36.0–46.0)
Hemoglobin: 13.4 g/dL (ref 12.0–15.0)
Immature Granulocytes: 0 %
Lymphocytes Relative: 46 %
Lymphs Abs: 3.1 10*3/uL (ref 0.7–4.0)
MCH: 32.7 pg (ref 26.0–34.0)
MCHC: 33.8 g/dL (ref 30.0–36.0)
MCV: 96.8 fL (ref 80.0–100.0)
Monocytes Absolute: 0.6 10*3/uL (ref 0.1–1.0)
Monocytes Relative: 9 %
Neutro Abs: 2.8 10*3/uL (ref 1.7–7.7)
Neutrophils Relative %: 42 %
Platelets: 271 10*3/uL (ref 150–400)
RBC: 4.1 MIL/uL (ref 3.87–5.11)
RDW: 12.3 % (ref 11.5–15.5)
WBC: 6.6 10*3/uL (ref 4.0–10.5)
nRBC: 0 % (ref 0.0–0.2)

## 2021-09-21 LAB — COMPREHENSIVE METABOLIC PANEL
ALT: 8 U/L (ref 0–44)
AST: 22 U/L (ref 15–41)
Albumin: 4 g/dL (ref 3.5–5.0)
Alkaline Phosphatase: 22 U/L — ABNORMAL LOW (ref 38–126)
Anion gap: 4 — ABNORMAL LOW (ref 5–15)
BUN: 16 mg/dL (ref 8–23)
CO2: 28 mmol/L (ref 22–32)
Calcium: 9.3 mg/dL (ref 8.9–10.3)
Chloride: 107 mmol/L (ref 98–111)
Creatinine, Ser: 0.94 mg/dL (ref 0.44–1.00)
GFR, Estimated: >60 mL/min (ref >60)
Glucose, Bld: 93 mg/dL (ref 70–99)
Potassium: 4.5 mmol/L (ref 3.5–5.1)
Sodium: 139 mmol/L (ref 135–145)
Total Bilirubin: 0.5 mg/dL (ref 0.3–1.2)
Total Protein: 6.5 g/dL (ref 6.5–8.1)

## 2021-12-18 DIAGNOSIS — M79604 Pain in right leg: Secondary | ICD-10-CM | POA: Diagnosis not present

## 2021-12-18 DIAGNOSIS — I83893 Varicose veins of bilateral lower extremities with other complications: Secondary | ICD-10-CM | POA: Diagnosis not present

## 2021-12-18 DIAGNOSIS — M79661 Pain in right lower leg: Secondary | ICD-10-CM | POA: Diagnosis not present

## 2021-12-18 DIAGNOSIS — M79662 Pain in left lower leg: Secondary | ICD-10-CM | POA: Diagnosis not present

## 2022-01-29 DIAGNOSIS — I872 Venous insufficiency (chronic) (peripheral): Secondary | ICD-10-CM | POA: Diagnosis not present

## 2022-01-31 ENCOUNTER — Ambulatory Visit: Payer: Medicare Other | Admitting: Physician Assistant

## 2022-02-05 DIAGNOSIS — I83891 Varicose veins of right lower extremities with other complications: Secondary | ICD-10-CM | POA: Diagnosis not present

## 2022-02-05 DIAGNOSIS — I872 Venous insufficiency (chronic) (peripheral): Secondary | ICD-10-CM | POA: Diagnosis not present

## 2022-02-09 ENCOUNTER — Other Ambulatory Visit: Payer: Self-pay | Admitting: Hematology

## 2022-02-09 DIAGNOSIS — C50211 Malignant neoplasm of upper-inner quadrant of right female breast: Secondary | ICD-10-CM

## 2022-02-12 DIAGNOSIS — M79661 Pain in right lower leg: Secondary | ICD-10-CM | POA: Diagnosis not present

## 2022-02-12 DIAGNOSIS — I83891 Varicose veins of right lower extremities with other complications: Secondary | ICD-10-CM | POA: Diagnosis not present

## 2022-02-12 DIAGNOSIS — Z09 Encounter for follow-up examination after completed treatment for conditions other than malignant neoplasm: Secondary | ICD-10-CM | POA: Diagnosis not present

## 2022-02-27 DIAGNOSIS — I87391 Chronic venous hypertension (idiopathic) with other complications of right lower extremity: Secondary | ICD-10-CM | POA: Diagnosis not present

## 2022-02-27 DIAGNOSIS — I83891 Varicose veins of right lower extremities with other complications: Secondary | ICD-10-CM | POA: Diagnosis not present

## 2022-03-02 DIAGNOSIS — M7989 Other specified soft tissue disorders: Secondary | ICD-10-CM | POA: Diagnosis not present

## 2022-03-02 DIAGNOSIS — I83812 Varicose veins of left lower extremities with pain: Secondary | ICD-10-CM | POA: Diagnosis not present

## 2022-03-02 DIAGNOSIS — I83892 Varicose veins of left lower extremities with other complications: Secondary | ICD-10-CM | POA: Diagnosis not present

## 2022-03-14 DIAGNOSIS — I83891 Varicose veins of right lower extremities with other complications: Secondary | ICD-10-CM | POA: Diagnosis not present

## 2022-03-14 DIAGNOSIS — I83811 Varicose veins of right lower extremities with pain: Secondary | ICD-10-CM | POA: Diagnosis not present

## 2022-04-09 DIAGNOSIS — I83812 Varicose veins of left lower extremities with pain: Secondary | ICD-10-CM | POA: Diagnosis not present

## 2022-04-09 DIAGNOSIS — I83892 Varicose veins of left lower extremities with other complications: Secondary | ICD-10-CM | POA: Diagnosis not present

## 2022-04-09 DIAGNOSIS — M7989 Other specified soft tissue disorders: Secondary | ICD-10-CM | POA: Diagnosis not present

## 2022-04-11 DIAGNOSIS — I83811 Varicose veins of right lower extremities with pain: Secondary | ICD-10-CM | POA: Diagnosis not present

## 2022-04-11 DIAGNOSIS — M7989 Other specified soft tissue disorders: Secondary | ICD-10-CM | POA: Diagnosis not present

## 2022-04-11 DIAGNOSIS — I83891 Varicose veins of right lower extremities with other complications: Secondary | ICD-10-CM | POA: Diagnosis not present

## 2022-04-16 DIAGNOSIS — L578 Other skin changes due to chronic exposure to nonionizing radiation: Secondary | ICD-10-CM | POA: Diagnosis not present

## 2022-04-16 DIAGNOSIS — Z872 Personal history of diseases of the skin and subcutaneous tissue: Secondary | ICD-10-CM | POA: Diagnosis not present

## 2022-04-16 DIAGNOSIS — Z86007 Personal history of in-situ neoplasm of skin: Secondary | ICD-10-CM | POA: Diagnosis not present

## 2022-04-19 ENCOUNTER — Ambulatory Visit: Payer: Medicare Other | Admitting: Physician Assistant

## 2022-04-20 ENCOUNTER — Encounter: Payer: Self-pay | Admitting: Hematology

## 2022-04-20 DIAGNOSIS — C50211 Malignant neoplasm of upper-inner quadrant of right female breast: Secondary | ICD-10-CM | POA: Diagnosis not present

## 2022-04-20 DIAGNOSIS — Z170 Estrogen receptor positive status [ER+]: Secondary | ICD-10-CM | POA: Diagnosis not present

## 2022-04-24 DIAGNOSIS — I83892 Varicose veins of left lower extremities with other complications: Secondary | ICD-10-CM | POA: Diagnosis not present

## 2022-05-17 DIAGNOSIS — I8001 Phlebitis and thrombophlebitis of superficial vessels of right lower extremity: Secondary | ICD-10-CM | POA: Diagnosis not present

## 2022-05-18 DIAGNOSIS — C50911 Malignant neoplasm of unspecified site of right female breast: Secondary | ICD-10-CM | POA: Diagnosis not present

## 2022-05-18 DIAGNOSIS — E039 Hypothyroidism, unspecified: Secondary | ICD-10-CM | POA: Diagnosis not present

## 2022-05-18 DIAGNOSIS — Z0000 Encounter for general adult medical examination without abnormal findings: Secondary | ICD-10-CM | POA: Diagnosis not present

## 2022-05-18 DIAGNOSIS — M810 Age-related osteoporosis without current pathological fracture: Secondary | ICD-10-CM | POA: Diagnosis not present

## 2022-05-18 DIAGNOSIS — E782 Mixed hyperlipidemia: Secondary | ICD-10-CM | POA: Diagnosis not present

## 2022-05-18 DIAGNOSIS — D84821 Immunodeficiency due to drugs: Secondary | ICD-10-CM | POA: Diagnosis not present

## 2022-05-22 NOTE — Progress Notes (Unsigned)
Patient Care Team: Kristen Loader, FNP as PCP - General (Family Medicine) Rockwell Germany, RN as Oncology Nurse Navigator Mauro Kaufmann, RN as Oncology Nurse Navigator Stark Klein, MD as Consulting Physician (General Surgery) Truitt Merle, MD as Consulting Physician (Hematology) Eppie Gibson, MD as Attending Physician (Radiation Oncology) Alla Feeling, NP as Nurse Practitioner (Nurse Practitioner) Starlyn Skeans as Physician Assistant (Dermatology)   I connected with Rachael Moore on 05/23/22 at  9:00 AM EST by telephone visit and verified that I am speaking with the correct person using two identifiers.   I discussed the limitations, risks, security and privacy concerns of performing an evaluation and management service by telemedicine and the availability of in-person appointments. I also discussed with the patient that there may be a patient responsible charge related to this service. The patient expressed understanding and agreed to proceed.   Other persons participating in the visit and their role in the encounter: None   Patient's location: Home Provider's location: Home  CHIEF COMPLAINT: Follow-up right breast cancer  Oncology History Overview Note  Cancer Staging Malignant neoplasm of upper-inner quadrant of right breast in female, estrogen receptor positive ([place]) Staging form: Breast, AJCC 8th Edition - Clinical stage from 08/23/2020: Stage IA (cT1b, cN0, cM0, G1, ER+, PR+, HER2-) - Signed by Truitt Merle, MD on 08/30/2020 Histologic grading system: 3 grade system - Pathologic stage from 09/29/2020: Stage Unknown (pT1b, pNX, cM0, G1, ER+, PR+, HER2-) - Signed by Truitt Merle, MD on 10/15/2020 Stage prefix: Initial diagnosis Histologic grading system: 3 grade system Residual tumor (R): R0 - None    Malignant neoplasm of upper-inner quadrant of right breast in female, estrogen receptor positive ([place])  08/15/2020 Mammogram   an irregular hypoechoic mass in  the RIGHT breast at the 2 o'clock axis, 6 cm from the nipple, measuring 8 x 5 mm, with internal vascularity, with associated architectural distortion, corresponding to the mammographic finding.   There is questionable intraductal extension peripheral to the mass, as manifested by mild ductal prominence up to 2 cm from the mass.   RIGHT axilla was evaluated with ultrasound showing no enlarged or morphologically abnormal lymph nodes   08/23/2020 Initial Biopsy   Diagnosis Breast, right, needle core biopsy, 2 o'clock - INVASIVE DUCTAL CARCINOMA - CALCIFICATIONS - SEE COMMENT  Microscopic Comment Based on the biopsy, the carcinoma appears Nottingham grade 1 of 3 and measures 0.4 cm in greatest linear extent. Prognostic markers (ER/PR/ki-67/HER2) are pending and will be reported in an addendum. Dr. Saralyn Pilar reviewed the case and agrees with the above diagnosis. These results were called to [facility] on August 24, 2020.    08/23/2020 Receptors her2   PROGNOSTIC INDICATORS Results: IMMUNOHISTOCHEMICAL AND MORPHOMETRIC ANALYSIS PERFORMED MANUALLY The tumor cells are EQUIVOCAL for Her2 (2+). Her2 by FISH will be performed and results reported separately. Estrogen Receptor: 100%, POSITIVE, STRONG STAINING INTENSITY Progesterone Receptor: 90%, POSITIVE, STRONG STAINING INTENSITY Proliferation Marker Ki67: 1%   FLUORESCENCE IN-SITU HYBRIDIZATION Results: GROUP 5: HER2 **NEGATIVE** Equivocal form of amplification of the HER2 gene was detected in the IHC 2+ tissue sample received from this individual. HER2 FISH was performed by a technologist and cell imaging and analysis on the BioView.   08/23/2020 Cancer Staging   Staging form: Breast, AJCC 8th Edition - Clinical stage from 08/23/2020: Stage IA (cT1b, cN0, cM0, G1, ER+, PR+, HER2-) - Signed by Truitt Merle, MD on 08/30/2020 Histologic grading system: 3 grade system  08/25/2020 Initial Diagnosis   Malignant neoplasm of  upper-inner quadrant of right breast in female, estrogen receptor positive ([place])   09/29/2020 Cancer Staging   Staging form: Breast, AJCC 8th Edition - Pathologic stage from 09/29/2020: Stage Unknown (pT1b, pNX, cM0, G1, ER+, PR+, HER2-) - Signed by Truitt Merle, MD on 10/15/2020 Stage prefix: Initial diagnosis Histologic grading system: 3 grade system Residual tumor (R): R0 - None   09/29/2020 Definitive Surgery   FINAL MICROSCOPIC DIAGNOSIS:   A. BREAST, RIGHT, LUMPECTOMY:  -  Invasive ductal carcinoma, Nottingham grade 1 of 3, 0.9 cm  -  Ductal carcinoma in-situ, low-grade  -  Margins uninvolved by carcinoma (0.6 cm; anterior margin)  -  Previous biopsy site changes present  -  See oncology table and comment below   B. BREAST, RIGHT ADDITIONAL POSTERIOR MARGIN, EXCISION:  -  Focal fibrocystic changes with apocrine metaplasia  -  No residual carcinoma identified       CURRENT THERAPY: Tamoxifen, started 10/2020  INTERVAL HISTORY Ms. Amer presents for phone f/up as scheduled. Last seen by me 08/2021 and Dr. Barry Dienes 04/20/22 with breast exam which was benign. She is doing well overall. Still having sharp/breaking nails despite biotin, she doesn't think it helped and does not plan to continue once this bottle runs out. Denies other SE's from tamoxifen such as hot flashes, bone/joint pain, abnormal vaginal bleeding, or signs of thrombosis.   She notes I am not in network with her insurance, but Dr. Burr Medico is.  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   Calcium Carb-Cholecalciferol (CALCIUM 500 + D3) 500-600 MG-UNIT TABS Take 1 tablet by mouth daily.   Coenzyme Q10 (COQ10 PO) Take 1 capsule by mouth daily at 12 noon. midday   Fluorouracil (TOLAK) 4 % CREA Apply topical nightly for 14 days to face   levothyroxine (SYNTHROID) 50 MCG tablet Take 50 mcg by mouth daily before breakfast.   Magnesium 250 MG TABS Take 1 tablet by mouth daily.   Multiple Vitamins-Minerals (IMMUNE SUPPORT PO) Take 1 tablet by mouth daily at 12 noon. midday   Multiple Vitamins-Minerals (MULTIVITAMIN ADULTS 50+) TABS Take 1 tablet by mouth daily.   pravastatin (PRAVACHOL) 40 MG tablet Take 40 mg by mouth at bedtime.   tamoxifen (NOLVADEX) 20 MG tablet TAKE 1 TABLET BY MOUTH DAILY   No facility-administered encounter medications on file as of 05/23/2022.     There were no vitals filed for this visit. There is no height or weight on file to calculate BMI.   PHYSICAL EXAM Patient appears well over the phone. Voice is strong,  speech is clear. Mood/affect appear normal. No cough or conversational dyspnea     LAB DATE  No labs for this visit     ASSESSMENT & PLAN:Pari Brunett is a 72 y.o. Caucasian female with a history of Hypothyroidism and HTN     1. Malignant neoplasm of upper-inner quadrant of right breast, Stage IA, pT1bN0M0, ER+/PR+/HER2-, Grade I -Diagnosed 08/23/2020, s/p right breast lumpectomy by Dr. Barry Dienes 09/29/2020.  Due to her age and early stage disease, sentinel lymph node biopsy was not performed and Oncotype was not recommended -Based on LUMINA trial data, her risk of local  recurrence is likely less than 5% in the next 5 years, it was decided to forego adjuvant radiation -Given the strong ER/PR expression, moderate arthritis, and osteoporosis she proceeded with adjuvant tamoxifen starting 10/2020, for 5-10 years -Last mammogram 08/03/21 showed post-op changes and a benign dilated duct in the left breast, benign, and breast density cat B. Next mammo 08/06/22 already scheduled  -Ms. Montealegre is clinically doing well. Tolerating tamoxifen with brittle nails, no other SE's. Breast exam by Dr. Barry Dienes 03/2022 was benign. Overall no clinical concern for recurrence -Continue surveillance and tamoxifen.  -Next in person f/up 08/29/22 with Dr. Burr Medico as scheduled.   2.  Osteoporosis -Her bone density scan from November 2020 showed a T score -3.4 at [facility], she has osteoporosis.  She was prescribed Fosamax by her PCP but did not take it due to concern for side effects.  Dr. Burr Medico recommended Zometa, again hesitant due to side effects -She takes calcium, vitamin D, multivitamin, and engages in weightbearing exercise daily  -DEXA 04/24/2021 osteoporosis improved, T score -3.1. Repeat in 2-3 years -We reviewed the bone strengthening quality of tamoxifen, continue other measures   3. Health maintenance  -She gets routine PCP f/up and does annual cologuard.  -I reviewed rationale for annual pelvic exam while on tamoxifen due to the risk of endometrial/uterine cancer. She does not need PAP smear. She is waiting for female provider to start at her PCP office in September -Continue healthy lifestyle  PLAN: -Continue breast cancer surveillance and Tamoxifen -Mammo in 08/06/22 as scheduled -lab and in person f/up with [place] in 08/29/22 as scheduled  -cc note to PCP     I discussed the assessment and treatment plan with the patient. The patient was provided an opportunity to ask questions and all were answered. The patient agreed with the plan and demonstrated an understanding of the  instructions.   The patient was advised to call back or seek an in-person evaluation if the symptoms worsen or if the condition fails to improve as anticipated. No barrier to learning were detected. I spent 11 minutes counseling the patient non face to face.  Cira Rue, NP-C 05/23/2022

## 2022-05-23 ENCOUNTER — Encounter: Payer: Self-pay | Admitting: Nurse Practitioner

## 2022-05-23 ENCOUNTER — Inpatient Hospital Stay: Payer: Medicare Other | Attending: Nurse Practitioner | Admitting: Nurse Practitioner

## 2022-05-23 DIAGNOSIS — C50211 Malignant neoplasm of upper-inner quadrant of right female breast: Secondary | ICD-10-CM

## 2022-05-23 DIAGNOSIS — Z170 Estrogen receptor positive status [ER+]: Secondary | ICD-10-CM

## 2022-08-06 ENCOUNTER — Ambulatory Visit
Admission: RE | Admit: 2022-08-06 | Discharge: 2022-08-06 | Disposition: A | Discharge status: Home / Self Care | Payer: Medicare Other | Source: Ambulatory Visit | Attending: Nurse Practitioner | Admitting: Nurse Practitioner

## 2022-08-06 DIAGNOSIS — Z170 Estrogen receptor positive status [ER+]: Secondary | ICD-10-CM

## 2022-08-06 DIAGNOSIS — Z853 Personal history of malignant neoplasm of breast: Secondary | ICD-10-CM | POA: Diagnosis not present

## 2022-08-28 ENCOUNTER — Other Ambulatory Visit: Payer: Self-pay

## 2022-08-28 DIAGNOSIS — C50211 Malignant neoplasm of upper-inner quadrant of right female breast: Secondary | ICD-10-CM

## 2022-08-29 ENCOUNTER — Inpatient Hospital Stay: Payer: Medicare Other | Admitting: Nurse Practitioner

## 2022-08-29 ENCOUNTER — Other Ambulatory Visit: Payer: Self-pay

## 2022-08-29 ENCOUNTER — Encounter: Payer: Self-pay | Admitting: Nurse Practitioner

## 2022-08-29 ENCOUNTER — Inpatient Hospital Stay: Payer: Medicare Other | Attending: Nurse Practitioner

## 2022-08-29 VITALS — BP 127/72 | HR 63 | Temp 97.7°F | Resp 19 | Wt 169.1 lb

## 2022-08-29 DIAGNOSIS — Z170 Estrogen receptor positive status [ER+]: Secondary | ICD-10-CM | POA: Insufficient documentation

## 2022-08-29 DIAGNOSIS — C50211 Malignant neoplasm of upper-inner quadrant of right female breast: Secondary | ICD-10-CM | POA: Diagnosis not present

## 2022-08-29 DIAGNOSIS — Z79810 Long term (current) use of selective estrogen receptor modulators (SERMs): Secondary | ICD-10-CM | POA: Insufficient documentation

## 2022-08-29 LAB — CMP (CANCER CENTER ONLY)
ALT: 8 U/L (ref 0–44)
AST: 22 U/L (ref 15–41)
Albumin: 4.1 g/dL (ref 3.5–5.0)
Alkaline Phosphatase: 28 U/L — ABNORMAL LOW (ref 38–126)
Anion gap: 6 (ref 5–15)
BUN: 15 mg/dL (ref 8–23)
CO2: 26 mmol/L (ref 22–32)
Calcium: 9.3 mg/dL (ref 8.9–10.3)
Chloride: 108 mmol/L (ref 98–111)
Creatinine: 0.96 mg/dL (ref 0.44–1.00)
GFR, Estimated: >60 mL/min (ref >60)
Glucose, Bld: 91 mg/dL (ref 70–99)
Potassium: 4.2 mmol/L (ref 3.5–5.1)
Sodium: 140 mmol/L (ref 135–145)
Total Bilirubin: 0.5 mg/dL (ref 0.3–1.2)
Total Protein: 6.8 g/dL (ref 6.5–8.1)

## 2022-08-29 LAB — CBC WITH DIFFERENTIAL (CANCER CENTER ONLY)
Abs Immature Granulocytes: 0.01 10*3/uL (ref 0.00–0.07)
Basophils Absolute: 0.1 10*3/uL (ref 0.0–0.1)
Basophils Relative: 1 %
Eosinophils Absolute: 0.1 10*3/uL (ref 0.0–0.5)
Eosinophils Relative: 2 %
HCT: 40.4 % (ref 36.0–46.0)
Hemoglobin: 13.8 g/dL (ref 12.0–15.0)
Immature Granulocytes: 0 %
Lymphocytes Relative: 45 %
Lymphs Abs: 3.2 10*3/uL (ref 0.7–4.0)
MCH: 32.9 pg (ref 26.0–34.0)
MCHC: 34.2 g/dL (ref 30.0–36.0)
MCV: 96.4 fL (ref 80.0–100.0)
Monocytes Absolute: 0.7 10*3/uL (ref 0.1–1.0)
Monocytes Relative: 10 %
Neutro Abs: 2.8 10*3/uL (ref 1.7–7.7)
Neutrophils Relative %: 42 %
Platelet Count: 259 10*3/uL (ref 150–400)
RBC: 4.19 MIL/uL (ref 3.87–5.11)
RDW: 12.8 % (ref 11.5–15.5)
WBC Count: 6.8 10*3/uL (ref 4.0–10.5)
nRBC: 0 % (ref 0.0–0.2)

## 2022-08-29 NOTE — Progress Notes (Signed)
Patient Care Team: Soundra Pilon, FNP as PCP - General (Family Medicine) Donnelly Angelica, RN as Oncology Nurse Navigator Lu Duffel, Margretta Ditty, RN as Oncology Nurse Navigator Almond Lint, MD as Consulting Physician (General Surgery) Malachy Mood, MD as Consulting Physician (Hematology) Lonie Peak, MD as Attending Physician (Radiation Oncology) Pollyann Samples, NP as Nurse Practitioner (Nurse Practitioner) Glyn Ade, PA-C as Physician Assistant (Dermatology)   CHIEF COMPLAINT: Follow up right breast cancer   Oncology History Overview Note  Cancer Staging Malignant neoplasm of upper-inner quadrant of right breast in female, estrogen receptor positive [facility]) Staging form: Breast, AJCC 8th Edition - Clinical stage from 08/23/2020: Stage IA (cT1b, cN0, cM0, G1, ER+, PR+, HER2-) - Signed by Malachy Mood, MD on 08/30/2020 Histologic grading system: 3 grade system - Pathologic stage from 09/29/2020: Stage Unknown (pT1b, pNX, cM0, G1, ER+, PR+, HER2-) - Signed by Malachy Mood, MD on 10/15/2020 Stage prefix: Initial diagnosis Histologic grading system: 3 grade system Residual tumor (R): R0 - None    Malignant neoplasm of upper-inner quadrant of right breast in female, estrogen receptor positive (HCC)  08/15/2020 Mammogram   an irregular hypoechoic mass in the RIGHT breast at the 2 o'clock axis, 6 cm from the nipple, measuring 8 x 5 mm, with internal vascularity, with associated architectural distortion, corresponding to the mammographic finding.   There is questionable intraductal extension peripheral to the mass, as manifested by mild ductal prominence up to 2 cm from the mass.   RIGHT axilla was evaluated with ultrasound showing no enlarged or morphologically abnormal lymph nodes   08/23/2020 Initial Biopsy   Diagnosis Breast, right, needle core biopsy, 2 o'clock - INVASIVE DUCTAL CARCINOMA - CALCIFICATIONS - SEE COMMENT  Microscopic Comment Based on the biopsy, the carcinoma  appears Nottingham grade 1 of 3 and measures 0.4 cm in greatest linear extent. Prognostic markers (ER/PR/ki-67/HER2) are pending and will be reported in an addendum. Dr. Luisa Hart reviewed the case and agrees with the above diagnosis. These results were called to [facility] on August 24, 2020.    08/23/2020 Receptors her2   PROGNOSTIC INDICATORS Results: IMMUNOHISTOCHEMICAL AND MORPHOMETRIC ANALYSIS PERFORMED MANUALLY The tumor cells are EQUIVOCAL for Her2 (2+). Her2 by FISH will be performed and results reported separately. Estrogen Receptor: 100%, POSITIVE, STRONG STAINING INTENSITY Progesterone Receptor: 90%, POSITIVE, STRONG STAINING INTENSITY Proliferation Marker Ki67: 1%   FLUORESCENCE IN-SITU HYBRIDIZATION Results: GROUP 5: HER2 **NEGATIVE** Equivocal form of amplification of the HER2 gene was detected in the IHC 2+ tissue sample received from this individual. HER2 FISH was performed by a technologist and cell imaging and analysis on the BioView.   08/23/2020 Cancer Staging   Staging form: Breast, AJCC 8th Edition - Clinical stage from 08/23/2020: Stage IA (cT1b, cN0, cM0, G1, ER+, PR+, HER2-) - Signed by Malachy Mood, MD on 08/30/2020 Histologic grading system: 3 grade system   08/25/2020 Initial Diagnosis   Malignant neoplasm of upper-inner quadrant of right breast in female, estrogen receptor positive (HCC)   09/29/2020 Cancer Staging   Staging form: Breast, AJCC 8th Edition - Pathologic stage from 09/29/2020: Stage Unknown (pT1b, pNX, cM0, G1, ER+, PR+, HER2-) - Signed by Malachy Mood, MD on 10/15/2020 Stage prefix: Initial diagnosis Histologic grading system: 3 grade system Residual tumor (R): R0 - None   09/29/2020 Definitive Surgery   FINAL MICROSCOPIC DIAGNOSIS:   A. BREAST, RIGHT, LUMPECTOMY:  -  Invasive ductal carcinoma, Nottingham grade 1 of 3, 0.9 cm  -  Ductal carcinoma in-situ, low-grade  -  Margins uninvolved by carcinoma (0.6 cm; anterior margin)  -   Previous biopsy site changes present  -  See oncology table and comment below   B. BREAST, RIGHT ADDITIONAL POSTERIOR MARGIN, EXCISION:  -  Focal fibrocystic changes with apocrine metaplasia  -  No residual carcinoma identified       CURRENT THERAPY: Tamoxifen, started 10/2020  INTERVAL HISTORY Rachael Moore returns for follow up as scheduled. Last visit was virtual with me on 05/23/22. Mammogram 08/06/22 was negative.  Denies concerns in her breast such as new lump/mass, nipple discharge or inversion, or skin change.  She continues tamoxifen, tolerating well with no side effects except brittle nails.  Takes biotin when she remembers.  She is up-to-date on other age-appropriate health maintenance.  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   Calcium Carb-Cholecalciferol (CALCIUM 500 + D3) 500-600 MG-UNIT TABS Take 1 tablet by mouth daily.   Coenzyme Q10 (COQ10 PO) Take 1 capsule by mouth daily at 12 noon. midday   Fluorouracil (TOLAK) 4 % CREA Apply topical nightly for 14 days to face   levothyroxine (SYNTHROID) 50 MCG tablet Take 50 mcg by mouth daily before breakfast.   Magnesium  250 MG TABS Take 1 tablet by mouth daily.   Multiple Vitamins-Minerals (IMMUNE SUPPORT PO) Take 1 tablet by mouth daily at 12 noon. midday   Multiple Vitamins-Minerals (MULTIVITAMIN ADULTS 50+) TABS Take 1 tablet by mouth daily.   pravastatin (PRAVACHOL) 40 MG tablet Take 40 mg by mouth at bedtime.   tamoxifen (NOLVADEX) 20 MG tablet TAKE 1 TABLET BY MOUTH DAILY   No facility-administered encounter medications on file as of 08/29/2022.     Today's Vitals   08/29/22 0904  BP: 127/72  Pulse: 63  Resp: 19  Temp: 97.7 F (36.5 C)  TempSrc: Oral  SpO2: 98%  Weight: 169 lb 1.6 oz (76.7 kg)   Body mass index is 25.71 kg/m.   PHYSICAL EXAM GENERAL:alert, no distress and comfortable SKIN: no rash  EYES: sclera clear NECK: without mass LYMPH:  no palpable cervical or supraclavicular lymphadenopathy  LUNGS:  normal breathing effort HEART: no lower extremity edema ABDOMEN: abdomen soft, non-tender and normal bowel sounds NEURO: alert & oriented x 3 with fluent speech, no focal motor/sensory deficits Breast exam: No nipple discharge or inversion.  S/p right lumpectomy, incisions completely healed.  No palpable mass or nodularity in either breast or axilla that I could appreciate.   CBC    Component Value Date/Time   WBC 6.8 08/29/2022 0843   WBC 6.6 09/21/2021 0908  RBC 4.19 08/29/2022 0843   HGB 13.8 08/29/2022 0843   HCT 40.4 08/29/2022 0843   PLT 259 08/29/2022 0843   MCV 96.4 08/29/2022 0843   MCH 32.9 08/29/2022 0843   MCHC 34.2 08/29/2022 0843   RDW 12.8 08/29/2022 0843   LYMPHSABS 3.2 08/29/2022 0843   MONOABS 0.7 08/29/2022 0843   EOSABS 0.1 08/29/2022 0843   BASOSABS 0.1 08/29/2022 0843     CMP     Component Value Date/Time   NA 140 08/29/2022 0843   K 4.2 08/29/2022 0843   CL 108 08/29/2022 0843   CO2 26 08/29/2022 0843   GLUCOSE 91 08/29/2022 0843   BUN 15 08/29/2022 0843   CREATININE 0.96 08/29/2022 0843   CALCIUM 9.3 08/29/2022 0843   PROT 6.8  08/29/2022 0843   ALBUMIN 4.1 08/29/2022 0843   AST 22 08/29/2022 0843   ALT 8 08/29/2022 0843   ALKPHOS 28 (L) 08/29/2022 0843   BILITOT 0.5 08/29/2022 0843   GFRNONAA >60 08/29/2022 0843     ASSESSMENT & PLAN:Rachael Moore is a 72 y.o. Caucasian female with a history of Hypothyroidism and HTN     1. Malignant neoplasm of upper-inner quadrant of right breast, Stage IA, pT1bN0M0, ER+/PR+/HER2-, Grade I -Diagnosed 08/23/2020, s/p right breast lumpectomy by Dr. Donell Beers 09/29/2020.  Due to her age and early stage disease, sentinel lymph node biopsy was not performed and Oncotype was not recommended -Based on LUMINA trial data, her risk of local recurrence is likely less than 5% in the next 5 years, it was decided to forego adjuvant radiation -Given the strong ER/PR expression, moderate arthritis, and osteoporosis she proceeded with adjuvant tamoxifen starting 10/2020, for 5-10 years -Rachael Moore is clinically doing well.  Tolerating tamoxifen with no significant side effects except brittle nails which is manageable.  Breast exam is benign, labs are normal.  Recent mammogram was negative. -Overall no clinical concern for breast cancer recurrence, continue surveillance -Follow-up with me in 6 months, or sooner if needed   2.  Osteoporosis -Her bone density scan from November 2020 showed a T score -3.4 at [facility], she has osteoporosis.  She was prescribed Fosamax by her PCP but did not take it due to concern for side effects.  Dr. Mosetta Putt recommended Zometa, again hesitant due to side effects -She takes calcium, vitamin D, multivitamin, and engages in weightbearing exercise daily  -DEXA 04/24/2021 osteoporosis improved, T score -3.1. Repeat in 2-3 years -We reviewed the bone strengthening quality of tamoxifen, continue other measures   3. Health maintenance  -She gets routine PCP f/up and annual cologuard.  -Pelvic exams per female provider at PCP office while on tamoxifen -Continue healthy  lifestyle   PLAN: -Recent mammogram and today's labs reviewed -Continue breast cancer surveillance and tamoxifen -Follow-up in 6 months, or sooner if needed (I verified that I am in network with pt's insurance)    All questions were answered. The patient knows to call the clinic with any problems, questions or concerns. No barriers to learning were detected.   Rachael Glad, NP-C 08/29/2022

## 2022-10-24 ENCOUNTER — Other Ambulatory Visit: Payer: Self-pay | Admitting: Hematology

## 2022-10-24 DIAGNOSIS — Z170 Estrogen receptor positive status [ER+]: Secondary | ICD-10-CM

## 2023-01-03 DIAGNOSIS — L700 Acne vulgaris: Secondary | ICD-10-CM | POA: Diagnosis not present

## 2023-01-03 DIAGNOSIS — B958 Unspecified staphylococcus as the cause of diseases classified elsewhere: Secondary | ICD-10-CM | POA: Diagnosis not present

## 2023-01-04 DIAGNOSIS — M79642 Pain in left hand: Secondary | ICD-10-CM | POA: Diagnosis not present

## 2023-01-04 DIAGNOSIS — S61412A Laceration without foreign body of left hand, initial encounter: Secondary | ICD-10-CM | POA: Diagnosis not present

## 2023-01-04 DIAGNOSIS — M19042 Primary osteoarthritis, left hand: Secondary | ICD-10-CM | POA: Diagnosis not present

## 2023-01-11 DIAGNOSIS — S61412D Laceration without foreign body of left hand, subsequent encounter: Secondary | ICD-10-CM | POA: Diagnosis not present

## 2023-01-31 DIAGNOSIS — Z1211 Encounter for screening for malignant neoplasm of colon: Secondary | ICD-10-CM | POA: Diagnosis not present

## 2023-02-07 IMAGING — US US BREAST*R* LIMITED INC AXILLA
1 series · 7 of 7 positions shown · non-contrast
Comparison: Previous exams including recent screening mammogram
dated 07/25/2020

CLINICAL DATA: Patient returns today to evaluate a possible RIGHT
breast asymmetry questioned on recent screening mammogram.

EXAM:
DIGITAL DIAGNOSTIC UNILATERAL RIGHT MAMMOGRAM WITH TOMOSYNTHESIS AND
CAD; ULTRASOUND RIGHT BREAST LIMITED
TECHNIQUE: Right digital diagnostic mammography and breast tomosynthesis was
performed. The images were evaluated with computer-aided detection.;
Targeted ultrasound examination of the right breast was performed

[Series 1: us breast*right* limited inc axilla · 0.05mm/px · 7 of 7 slices shown]
[im 1/7]
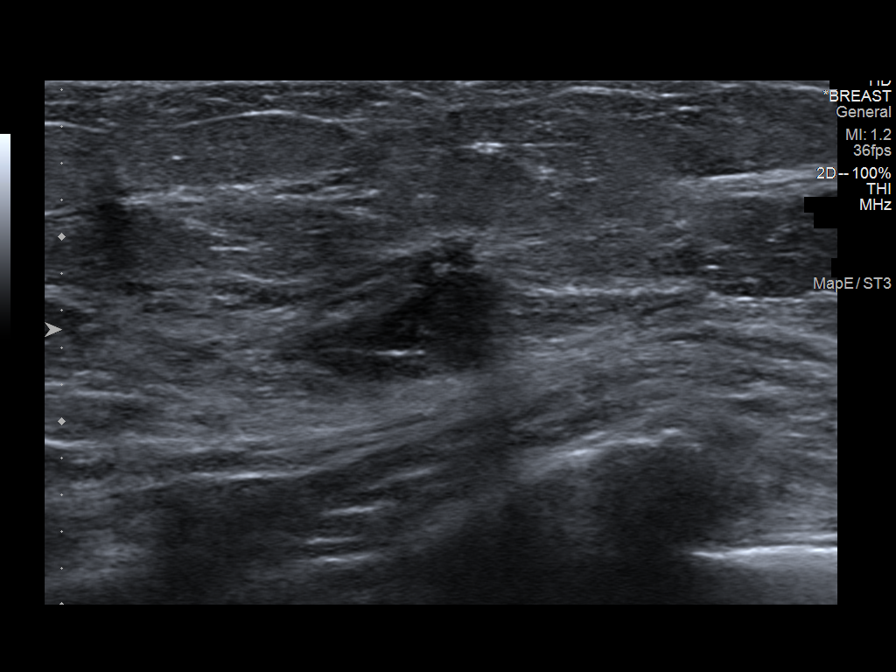
[im 2/7]
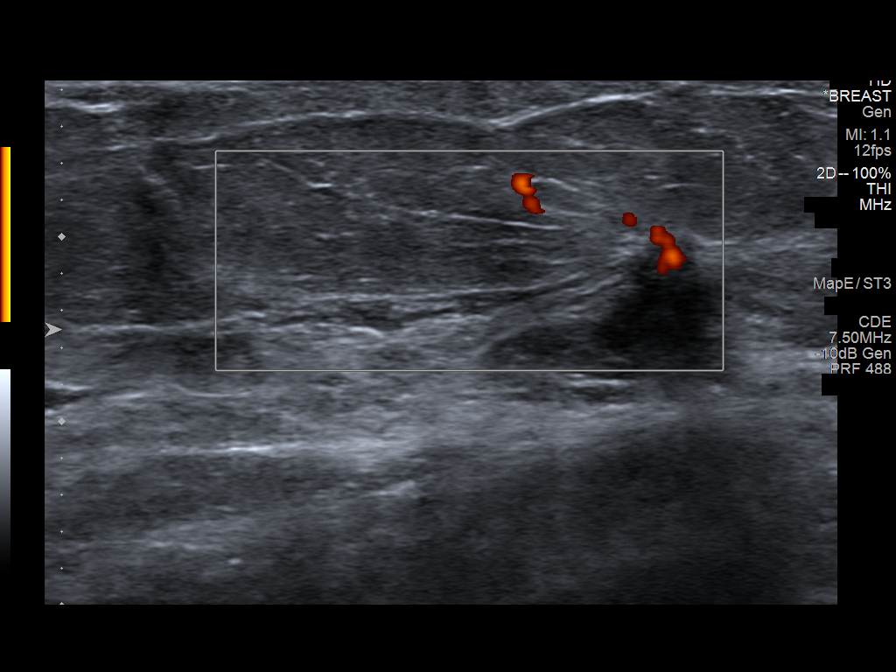
[im 3/7]
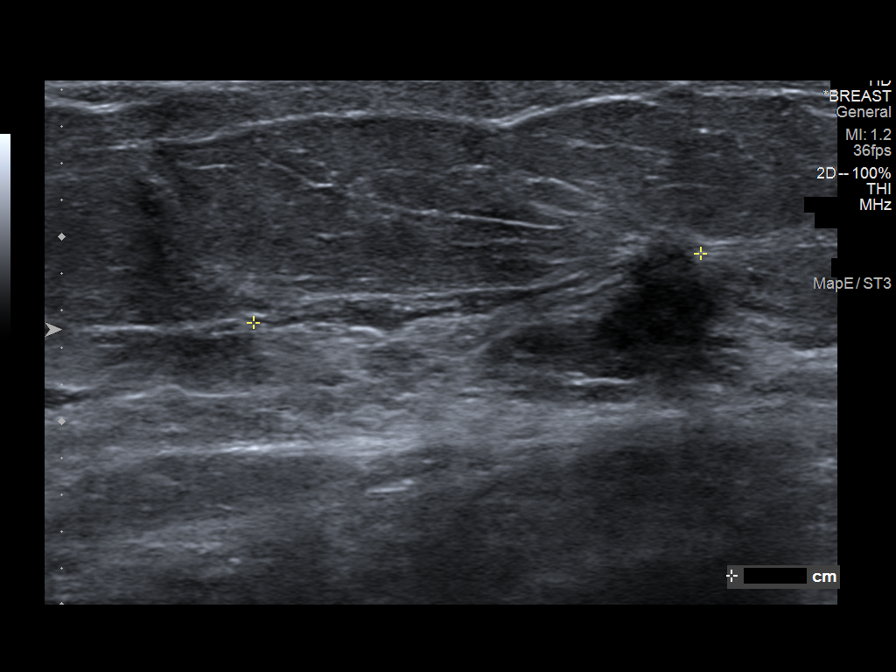
[im 4/7]
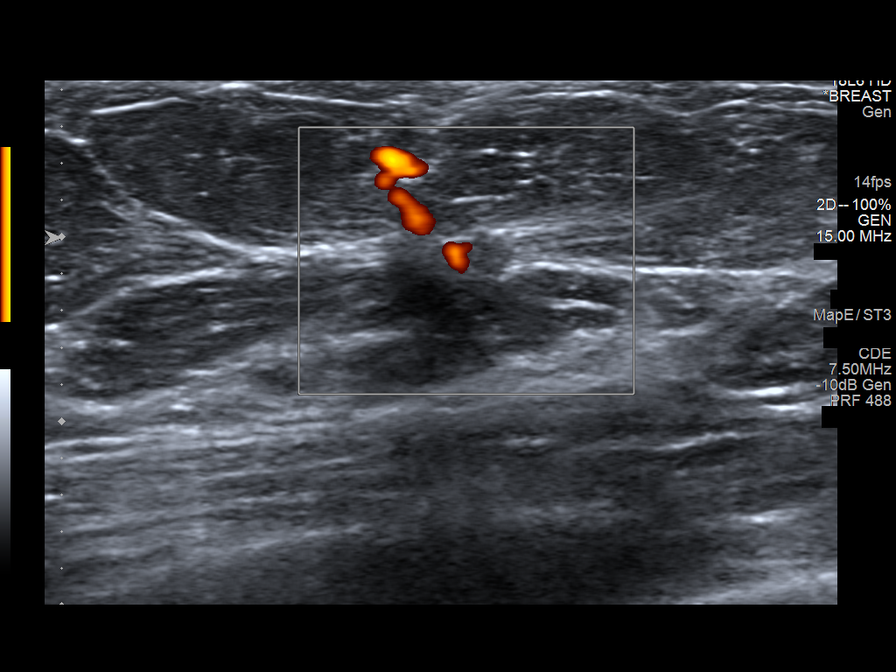
[im 5/7]
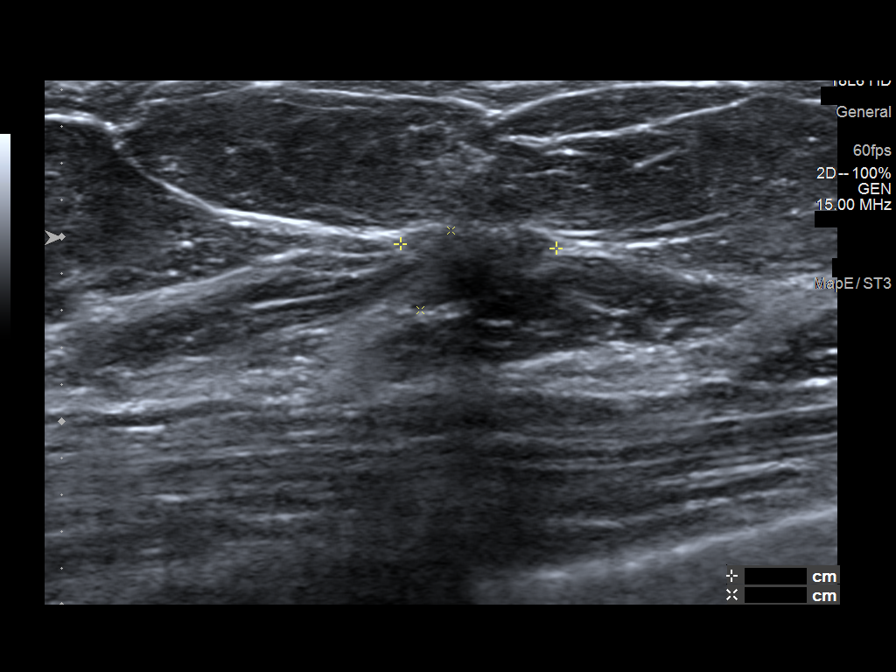
[im 6/7]
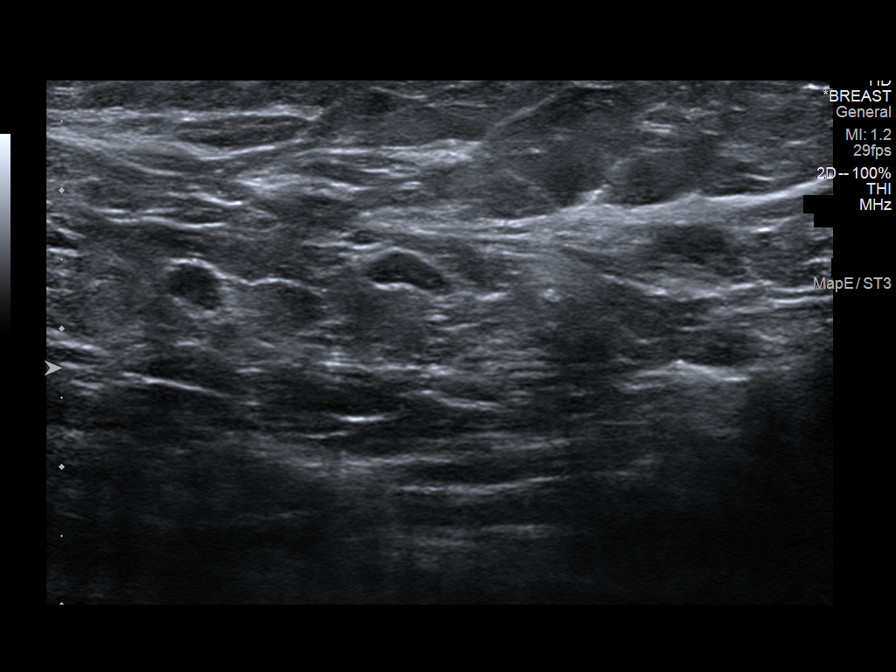
[im 7/7]
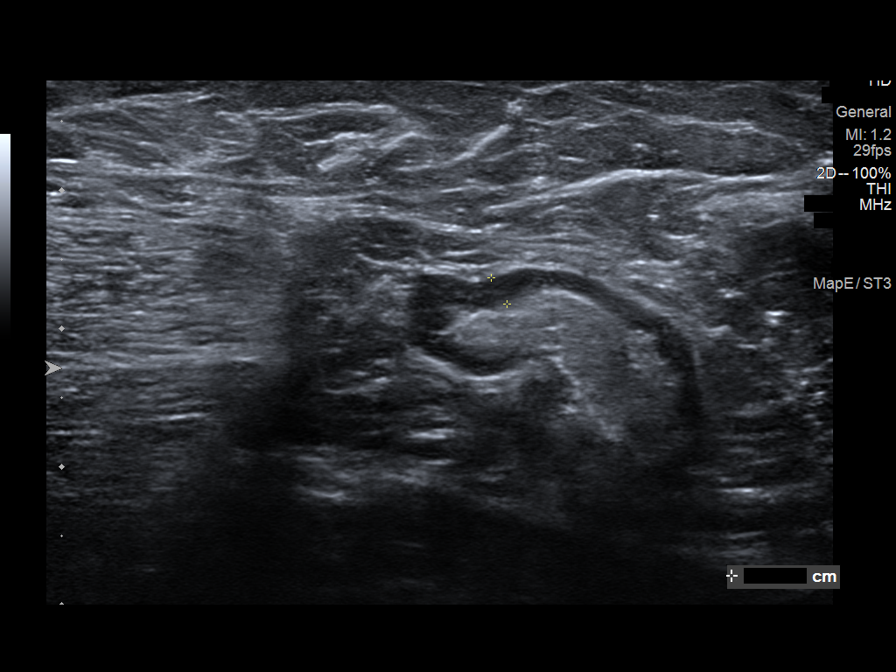

[7 of 7 positions shown; findings below may reference images not displayed]

ACR Breast Density Category b: There are scattered areas of
fibroglandular density.
FINDINGS: RIGHT breast diagnostic mammogram: On today's additional diagnostic
views, including spot compression with 3D tomosynthesis, an
irregular mass is confirmed within the inner RIGHT breast, 2-3
o'clock axis, measuring approximately 7 mm greatest dimension, with
associated architectural distortion.

Targeted ultrasound is performed, showing an irregular hypoechoic
mass in the RIGHT breast at the 2 o'clock axis, 6 cm from the
nipple, measuring 8 x 5 mm, with internal vascularity, with
associated architectural distortion, corresponding to the
mammographic finding.

There is questionable intraductal extension peripheral to the mass,
as manifested by mild ductal prominence up to 2 cm from the mass.

RIGHT axilla was evaluated with ultrasound showing no enlarged or
morphologically abnormal lymph nodes.
IMPRESSION: Highly suspicious mass within the RIGHT breast at the 2 o'clock
axis, 6 cm from nipple, measuring 8 mm, corresponding to the
mammographic finding. Ultrasound-guided biopsy is recommended.

RECOMMENDATION:
1. Ultrasound-guided biopsy for the highly suspicious RIGHT breast
mass at the 2 o'clock axis, 6 cm from the nipple, measuring 8 mm.
2. If malignancy is confirmed, recommend breast MRI to exclude
adjacent intraductal extension and thereby definitively define
extent.

Ultrasound-guided biopsy will be scheduled at patient's convenience.

I have discussed the findings and recommendations with the patient.
If applicable, a reminder letter will be sent to the patient
regarding the next appointment.

BI-RADS CATEGORY  5: Highly suggestive of malignancy.

## 2023-02-15 IMAGING — MG MM BREAST LOCALIZATION CLIP
4 series · 4 of 12 positions shown · non-contrast
Comparison: Previous exam(s).

CLINICAL DATA: Patient status post ultrasound-guided biopsy right
breast mass.

EXAM:
DIAGNOSTIC RIGHT MAMMOGRAM POST ULTRASOUND BIOPSY

[R ML synth-2D]
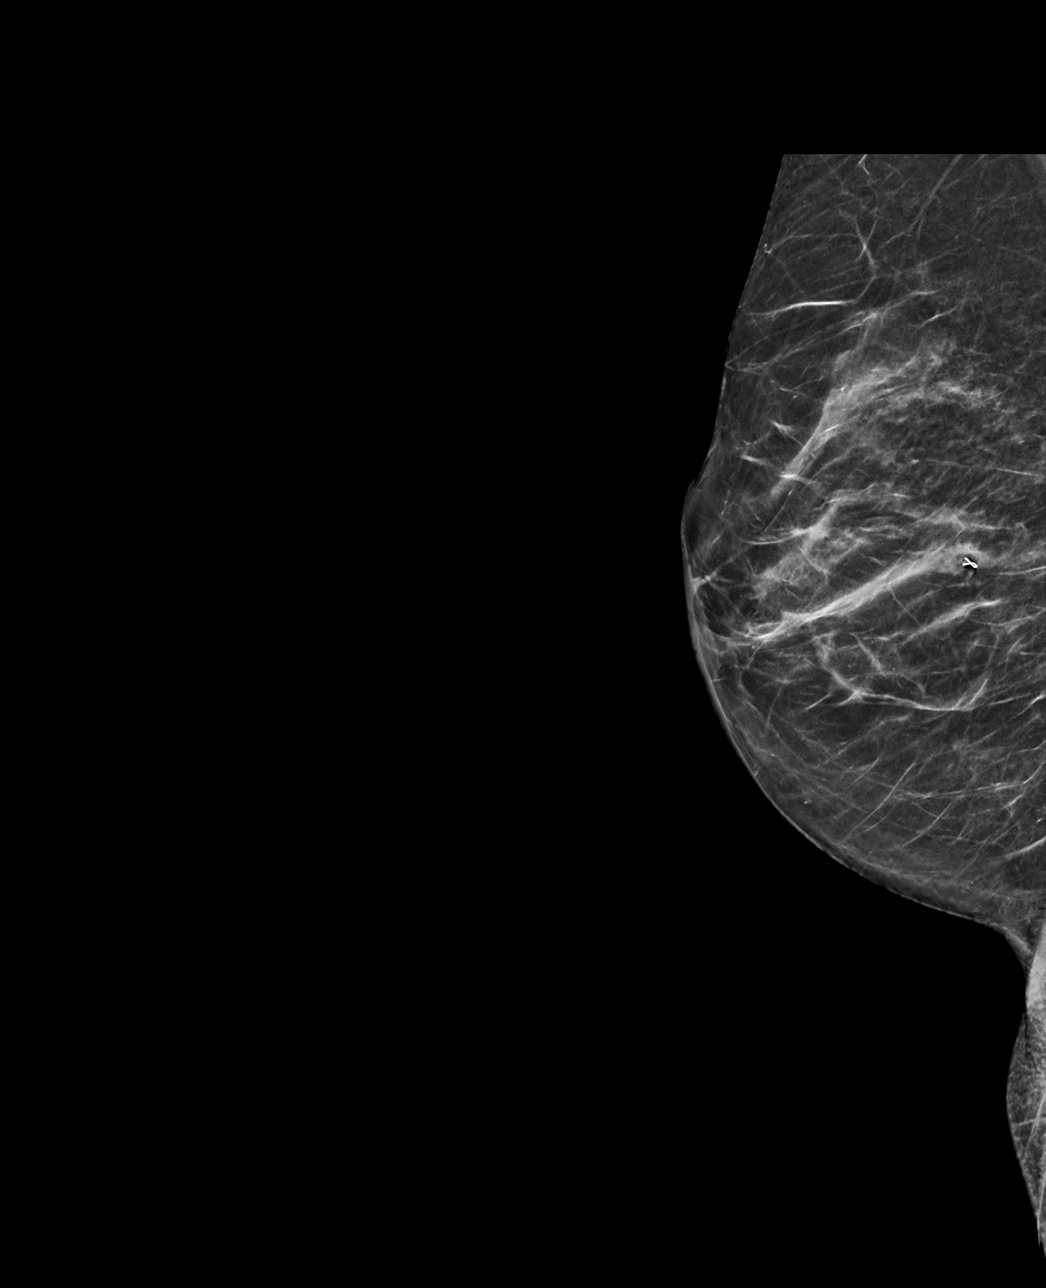

[R CC synth-2D]
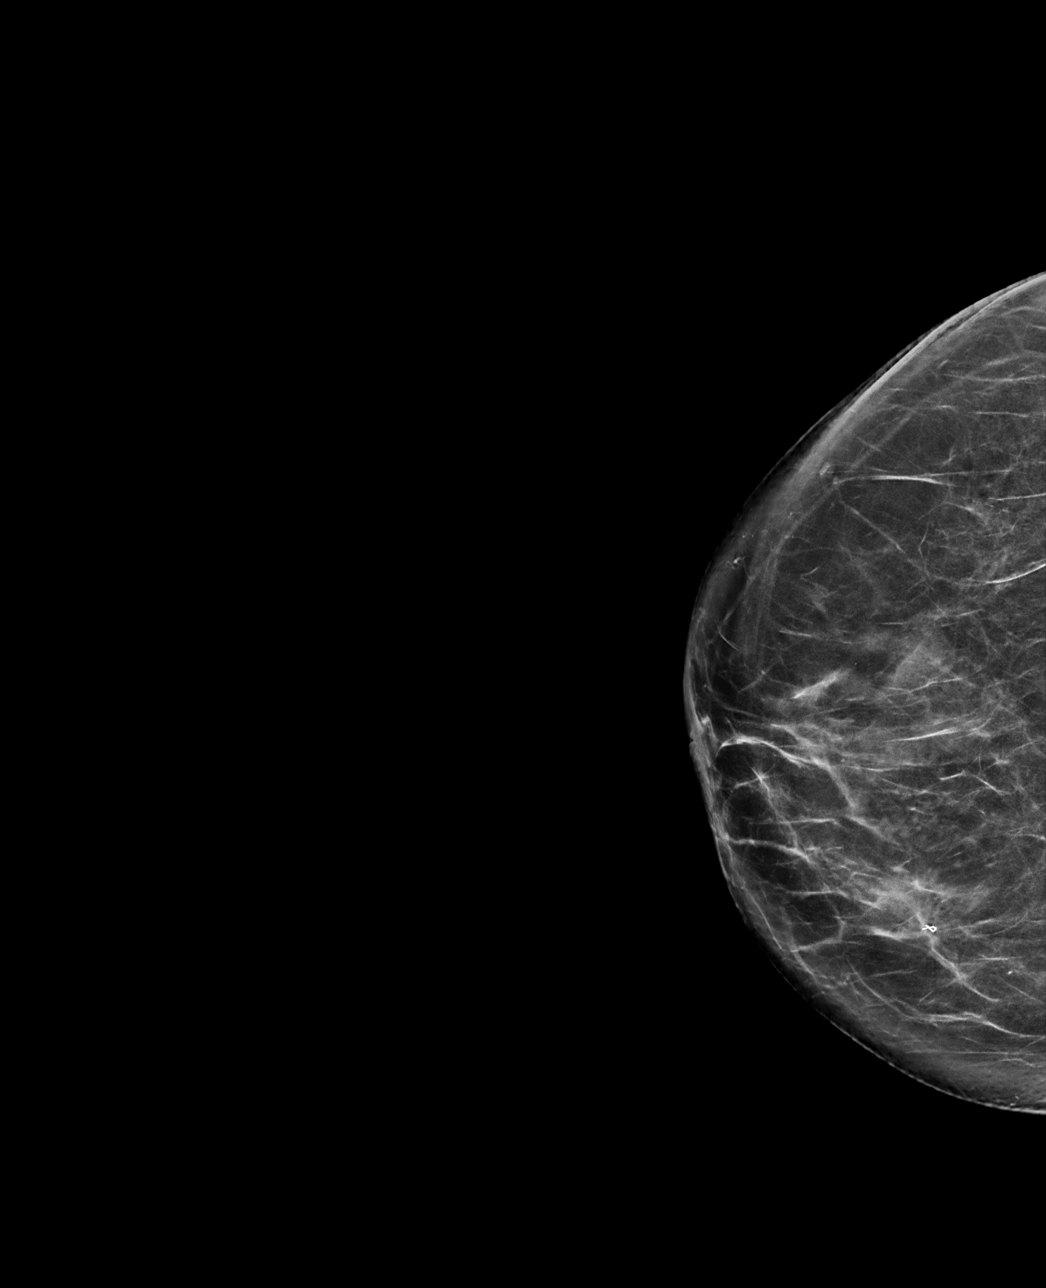

[R ML tomo · tomo slice 29/57.0]
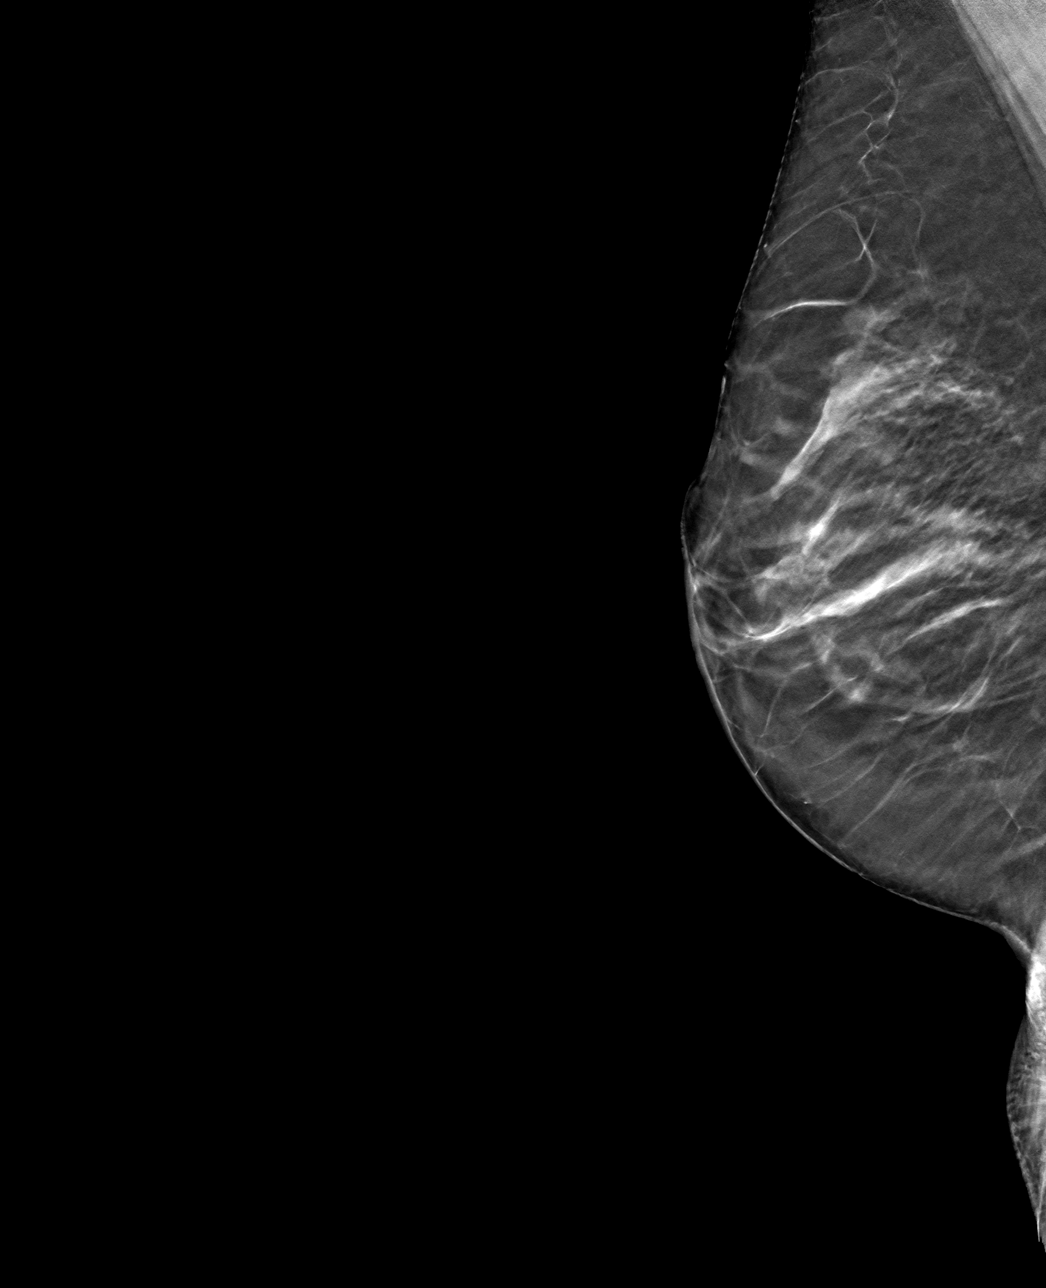

[R CC tomo · tomo slice 37/72.0]
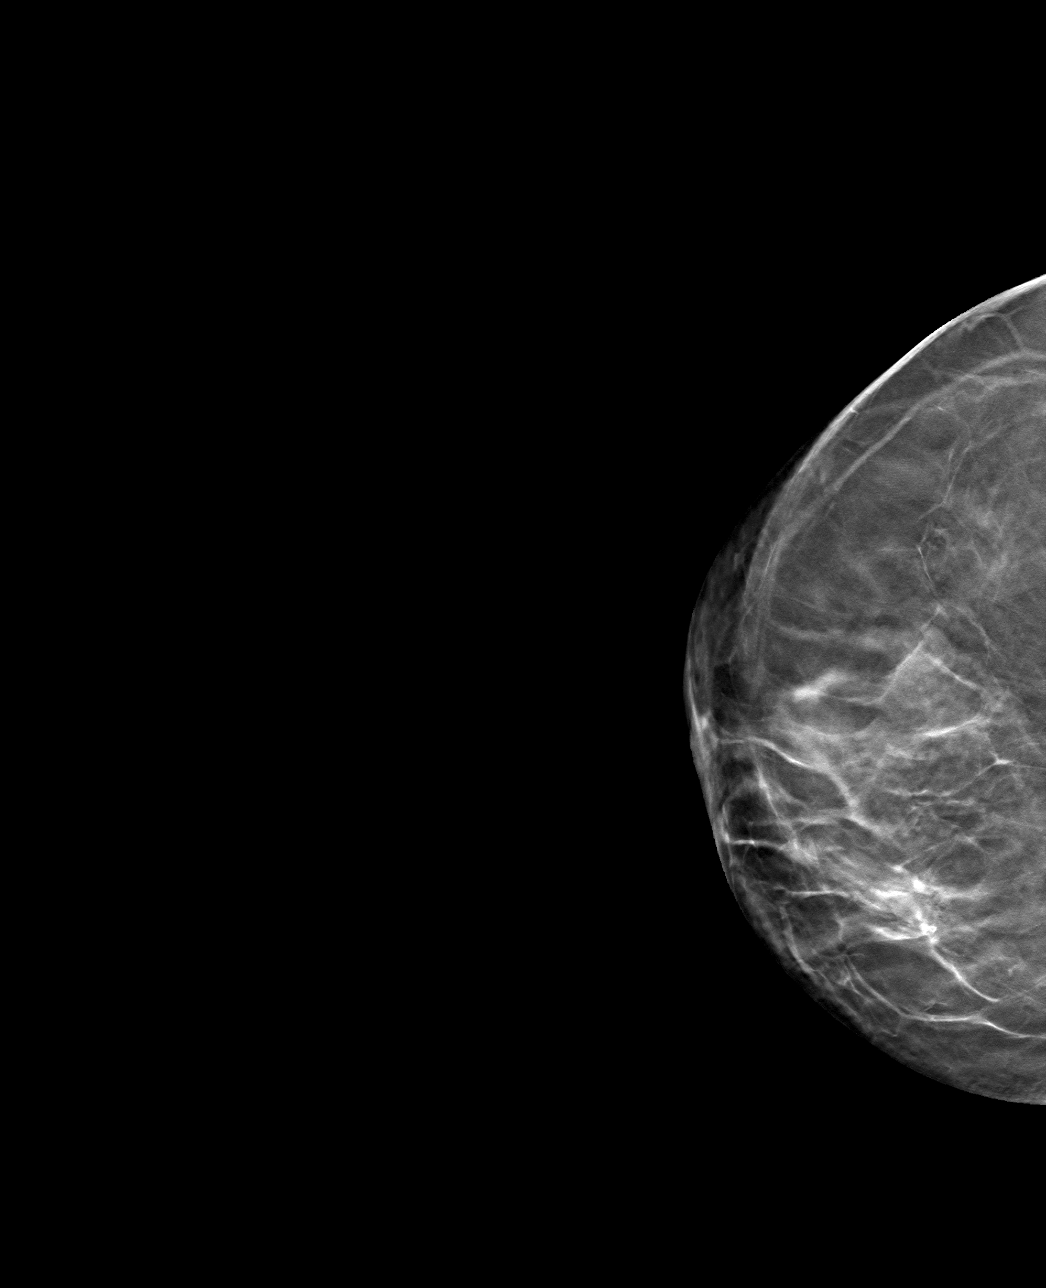

[4 of 12 positions shown; findings below may reference images not displayed]

FINDINGS: Mammographic images were obtained following ultrasound guided biopsy
of right breast mass. The biopsy marking clip is in expected
position at the site of biopsy.
IMPRESSION: Appropriate positioning of the ribbon shaped biopsy marking clip at
the site of biopsy in the right breast.

Final Assessment: Post Procedure Mammograms for Marker Placement

## 2023-02-26 NOTE — Progress Notes (Unsigned)
Patient Care Team: Rachael Pilon, FNP as PCP - General (Family Medicine) Rachael Angelica, RN as Oncology Nurse Navigator Lu Duffel, Rachael Ditty, RN as Oncology Nurse Navigator Rachael Lint, MD as Consulting Physician (General Surgery) Rachael Mood, MD as Consulting Physician (Hematology) Rachael Peak, MD as Attending Physician (Radiation Oncology) Rachael Samples, NP as Nurse Practitioner (Nurse Practitioner) Rachael Ade, PA-C as Physician Assistant (Dermatology)   CHIEF COMPLAINT: Follow up right breast cancer   Oncology History Overview Note  Cancer Staging Malignant neoplasm of upper-inner quadrant of right breast in female, estrogen receptor positive [facility]) Staging form: Breast, AJCC 8th Edition - Clinical stage from 08/23/2020: Stage IA (cT1b, cN0, cM0, G1, ER+, PR+, HER2-) - Signed by Rachael Mood, MD on 08/30/2020 Histologic grading system: 3 grade system - Pathologic stage from 09/29/2020: Stage Unknown (pT1b, pNX, cM0, G1, ER+, PR+, HER2-) - Signed by Rachael Mood, MD on 10/15/2020 Stage prefix: Initial diagnosis Histologic grading system: 3 grade system Residual tumor (R): R0 - None    Malignant neoplasm of upper-inner quadrant of right breast in female, estrogen receptor positive (HCC)  08/15/2020 Mammogram   an irregular hypoechoic mass in the RIGHT breast at the 2 o'clock axis, 6 cm from the nipple, measuring 8 x 5 mm, with internal vascularity, with associated architectural distortion, corresponding to the mammographic finding.   There is questionable intraductal extension peripheral to the mass, as manifested by mild ductal prominence up to 2 cm from the mass.   RIGHT axilla was evaluated with ultrasound showing no enlarged or morphologically abnormal lymph nodes   08/23/2020 Initial Biopsy   Diagnosis Breast, right, needle core biopsy, 2 o'clock - INVASIVE DUCTAL CARCINOMA - CALCIFICATIONS - SEE COMMENT  Microscopic Comment Based on the biopsy, the carcinoma  appears Nottingham grade 1 of 3 and measures 0.4 cm in greatest linear extent. Prognostic markers (ER/PR/ki-67/HER2) are pending and will be reported in an addendum. Dr. Luisa Moore reviewed the case and agrees with the above diagnosis. These results were called to [facility] on August 24, 2020.    08/23/2020 Receptors her2   PROGNOSTIC INDICATORS Results: IMMUNOHISTOCHEMICAL AND MORPHOMETRIC ANALYSIS PERFORMED MANUALLY The tumor cells are EQUIVOCAL for Her2 (2+). Her2 by FISH will be performed and results reported separately. Estrogen Receptor: 100%, POSITIVE, STRONG STAINING INTENSITY Progesterone Receptor: 90%, POSITIVE, STRONG STAINING INTENSITY Proliferation Marker Ki67: 1%   FLUORESCENCE IN-SITU HYBRIDIZATION Results: GROUP 5: HER2 **NEGATIVE** Equivocal form of amplification of the HER2 gene was detected in the IHC 2+ tissue sample received from this individual. HER2 FISH was performed by a technologist and cell imaging and analysis on the BioView.   08/23/2020 Cancer Staging   Staging form: Breast, AJCC 8th Edition - Clinical stage from 08/23/2020: Stage IA (cT1b, cN0, cM0, G1, ER+, PR+, HER2-) - Signed by Rachael Mood, MD on 08/30/2020 Histologic grading system: 3 grade system   08/25/2020 Initial Diagnosis   Malignant neoplasm of upper-inner quadrant of right breast in female, estrogen receptor positive (HCC)   09/29/2020 Cancer Staging   Staging form: Breast, AJCC 8th Edition - Pathologic stage from 09/29/2020: Stage Unknown (pT1b, pNX, cM0, G1, ER+, PR+, HER2-) - Signed by Rachael Mood, MD on 10/15/2020 Stage prefix: Initial diagnosis Histologic grading system: 3 grade system Residual tumor (R): R0 - None   09/29/2020 Definitive Surgery   FINAL MICROSCOPIC DIAGNOSIS:   A. BREAST, RIGHT, LUMPECTOMY:  -  Invasive ductal carcinoma, Nottingham grade 1 of 3, 0.9 cm  -  Ductal carcinoma in-situ, low-grade  -  Margins uninvolved by carcinoma (0.6 cm; anterior margin)  -   Previous biopsy site changes present  -  See oncology table and comment below   B. BREAST, RIGHT ADDITIONAL POSTERIOR MARGIN, EXCISION:  -  Focal fibrocystic changes with apocrine metaplasia  -  No residual carcinoma identified       CURRENT THERAPY: Tamoxifen, since 10/2020  INTERVAL HISTORY Rachael Moore returns for follow up as scheduled. Last seen by me 6 months ago. She continues tamoxifen.   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   Calcium Carb-Cholecalciferol (CALCIUM 500 + D3) 500-600 MG-UNIT TABS Take 1 tablet by mouth daily.   Coenzyme Q10 (COQ10 PO) Take 1 capsule by mouth daily at 12 noon. midday   Fluorouracil (TOLAK) 4 % CREA Apply topical nightly for 14 days to face   levothyroxine (SYNTHROID) 50 MCG tablet Take 50 mcg by mouth daily before breakfast.   Magnesium 250 MG TABS Take 1 tablet by mouth daily.   Multiple Vitamins-Minerals (IMMUNE SUPPORT PO) Take 1 tablet by mouth daily at 12 noon. midday   Multiple Vitamins-Minerals (MULTIVITAMIN ADULTS 50+) TABS Take 1 tablet by mouth daily.   pravastatin (PRAVACHOL) 40 MG tablet Take 40 mg by mouth at bedtime.   tamoxifen (NOLVADEX) 20 MG tablet TAKE  1 TABLET BY MOUTH DAILY   No facility-administered encounter medications on file as of 02/27/2023.     There were no vitals filed for this visit. There is no height or weight on file to calculate BMI.   PHYSICAL EXAM GENERAL:alert, no distress and comfortable SKIN: no rash  EYES: sclera clear NECK: without mass LYMPH:  no palpable cervical or supraclavicular lymphadenopathy  LUNGS: clear with normal breathing effort HEART: regular rate & rhythm, no lower extremity edema ABDOMEN: abdomen soft, non-tender and normal bowel sounds NEURO: alert & oriented x 3 with fluent speech, no focal motor/sensory deficits Breast exam:  PAC without erythema    CBC    Component Value Date/Time   WBC 6.8 08/29/2022 0843   WBC 6.6 09/21/2021 0908   RBC 4.19 08/29/2022 0843   HGB 13.8 08/29/2022 0843   HCT 40.4 08/29/2022 0843   PLT 259 08/29/2022 0843   MCV 96.4 08/29/2022 0843   MCH 32.9 08/29/2022 0843   MCHC 34.2 08/29/2022 0843   RDW 12.8 08/29/2022 0843   LYMPHSABS 3.2 08/29/2022 0843   MONOABS 0.7 08/29/2022 0843   EOSABS 0.1 08/29/2022 0843   BASOSABS 0.1 08/29/2022 0843     CMP     Component Value Date/Time   NA 140 08/29/2022 0843   K 4.2 08/29/2022  0843   CL 108 08/29/2022 0843   CO2 26 08/29/2022 0843   GLUCOSE 91 08/29/2022 0843   BUN 15 08/29/2022 0843   CREATININE 0.96 08/29/2022 0843   CALCIUM 9.3 08/29/2022 0843   PROT 6.8 08/29/2022 0843   ALBUMIN 4.1 08/29/2022 0843   AST 22 08/29/2022 0843   ALT 8 08/29/2022 0843   ALKPHOS 28 (L) 08/29/2022 0843   BILITOT 0.5 08/29/2022 0843   GFRNONAA >60 08/29/2022 0843     ASSESSMENT & PLAN:Rachael Moore is a 72 y.o. Caucasian female with a history of Hypothyroidism and HTN     1. Malignant neoplasm of upper-inner quadrant of right breast, Stage IA, pT1bN0M0, ER+/PR+/HER2-, Grade I -Diagnosed 08/23/2020, s/p right breast lumpectomy by Dr. Donell Beers 09/29/2020.  Due to her age and early stage disease, sentinel lymph node  biopsy was not performed and Oncotype was not recommended -Based on LUMINA trial data, her risk of local recurrence is likely less than 5% in the next 5 years, it was decided to forego adjuvant radiation -Given the strong ER/PR expression, moderate arthritis, and osteoporosis she proceeded with adjuvant tamoxifen starting 10/2020, for 5-10 years -Recent mammogram was negative.   2.  Osteoporosis -Her bone density scan from November 2020 showed a T score -3.4 at [facility], she has osteoporosis.  She was prescribed Fosamax by her PCP but did not take it due to concern for side effects.  Dr. Mosetta Putt recommended Zometa, again hesitant due to side effects -She takes calcium, vitamin D, multivitamin, and engages in weightbearing exercise daily  -DEXA 04/24/2021 osteoporosis improved, T score -3.1. Repeat in 2-3 years -We reviewed the bone strengthening quality of tamoxifen, continue other measures   3. Health maintenance  -She gets routine PCP f/up and annual cologuard.  -Pelvic exams per female provider at PCP office while on tamoxifen -Continue healthy lifestyle     PLAN:  No orders of the defined types were placed in this encounter.     All questions were answered. The patient knows to call the clinic with any problems, questions or concerns. No barriers to learning were detected. I spent *** counseling the patient face to face. The total time spent in the appointment was *** and more than 50% was on counseling, review of test results, and coordination of care.   Santiago Glad, NP-C @DATE @

## 2023-02-27 ENCOUNTER — Encounter: Payer: Self-pay | Admitting: Nurse Practitioner

## 2023-02-27 ENCOUNTER — Inpatient Hospital Stay: Payer: Medicare Other | Attending: Nurse Practitioner

## 2023-02-27 ENCOUNTER — Inpatient Hospital Stay: Payer: Medicare Other | Admitting: Nurse Practitioner

## 2023-02-27 VITALS — BP 127/69 | HR 78 | Temp 98.0°F | Resp 12 | Wt 176.2 lb

## 2023-02-27 DIAGNOSIS — Z79810 Long term (current) use of selective estrogen receptor modulators (SERMs): Secondary | ICD-10-CM | POA: Insufficient documentation

## 2023-02-27 DIAGNOSIS — Z170 Estrogen receptor positive status [ER+]: Secondary | ICD-10-CM

## 2023-02-27 DIAGNOSIS — C50211 Malignant neoplasm of upper-inner quadrant of right female breast: Secondary | ICD-10-CM

## 2023-02-27 LAB — CMP (CANCER CENTER ONLY)
ALT: 8 U/L (ref 0–44)
AST: 24 U/L (ref 15–41)
Albumin: 4 g/dL (ref 3.5–5.0)
Alkaline Phosphatase: 31 U/L — ABNORMAL LOW (ref 38–126)
Anion gap: 4 — ABNORMAL LOW (ref 5–15)
BUN: 13 mg/dL (ref 8–23)
CO2: 28 mmol/L (ref 22–32)
Calcium: 9.2 mg/dL (ref 8.9–10.3)
Chloride: 109 mmol/L (ref 98–111)
Creatinine: 0.81 mg/dL (ref 0.44–1.00)
GFR, Estimated: >60 mL/min (ref >60)
Glucose, Bld: 89 mg/dL (ref 70–99)
Potassium: 4.1 mmol/L (ref 3.5–5.1)
Sodium: 141 mmol/L (ref 135–145)
Total Bilirubin: 0.5 mg/dL (ref <1.2)
Total Protein: 6.6 g/dL (ref 6.5–8.1)

## 2023-02-27 LAB — CBC WITH DIFFERENTIAL (CANCER CENTER ONLY)
Abs Immature Granulocytes: 0.02 10*3/uL (ref 0.00–0.07)
Basophils Absolute: 0.1 10*3/uL (ref 0.0–0.1)
Basophils Relative: 1 %
Eosinophils Absolute: 0.1 10*3/uL (ref 0.0–0.5)
Eosinophils Relative: 1 %
HCT: 41.2 % (ref 36.0–46.0)
Hemoglobin: 13.6 g/dL (ref 12.0–15.0)
Immature Granulocytes: 0 %
Lymphocytes Relative: 41 %
Lymphs Abs: 2.9 10*3/uL (ref 0.7–4.0)
MCH: 32.7 pg (ref 26.0–34.0)
MCHC: 33 g/dL (ref 30.0–36.0)
MCV: 99 fL (ref 80.0–100.0)
Monocytes Absolute: 0.7 10*3/uL (ref 0.1–1.0)
Monocytes Relative: 10 %
Neutro Abs: 3.3 10*3/uL (ref 1.7–7.7)
Neutrophils Relative %: 47 %
Platelet Count: 296 10*3/uL (ref 150–400)
RBC: 4.16 MIL/uL (ref 3.87–5.11)
RDW: 12.4 % (ref 11.5–15.5)
WBC Count: 7.2 10*3/uL (ref 4.0–10.5)
nRBC: 0 % (ref 0.0–0.2)

## 2023-04-12 DIAGNOSIS — D1801 Hemangioma of skin and subcutaneous tissue: Secondary | ICD-10-CM | POA: Diagnosis not present

## 2023-04-12 DIAGNOSIS — C44321 Squamous cell carcinoma of skin of nose: Secondary | ICD-10-CM | POA: Diagnosis not present

## 2023-04-12 DIAGNOSIS — D229 Melanocytic nevi, unspecified: Secondary | ICD-10-CM | POA: Diagnosis not present

## 2023-04-12 DIAGNOSIS — Z86007 Personal history of in-situ neoplasm of skin: Secondary | ICD-10-CM | POA: Diagnosis not present

## 2023-04-12 DIAGNOSIS — D485 Neoplasm of uncertain behavior of skin: Secondary | ICD-10-CM | POA: Diagnosis not present

## 2023-04-12 DIAGNOSIS — L821 Other seborrheic keratosis: Secondary | ICD-10-CM | POA: Diagnosis not present

## 2023-04-12 DIAGNOSIS — L578 Other skin changes due to chronic exposure to nonionizing radiation: Secondary | ICD-10-CM | POA: Diagnosis not present

## 2023-04-12 DIAGNOSIS — L814 Other melanin hyperpigmentation: Secondary | ICD-10-CM | POA: Diagnosis not present

## 2023-05-15 DIAGNOSIS — C44321 Squamous cell carcinoma of skin of nose: Secondary | ICD-10-CM | POA: Diagnosis not present

## 2023-05-24 DIAGNOSIS — M810 Age-related osteoporosis without current pathological fracture: Secondary | ICD-10-CM | POA: Diagnosis not present

## 2023-05-24 DIAGNOSIS — E782 Mixed hyperlipidemia: Secondary | ICD-10-CM | POA: Diagnosis not present

## 2023-05-24 DIAGNOSIS — E039 Hypothyroidism, unspecified: Secondary | ICD-10-CM | POA: Diagnosis not present

## 2023-05-24 DIAGNOSIS — Z0000 Encounter for general adult medical examination without abnormal findings: Secondary | ICD-10-CM | POA: Diagnosis not present

## 2023-05-24 DIAGNOSIS — C50911 Malignant neoplasm of unspecified site of right female breast: Secondary | ICD-10-CM | POA: Diagnosis not present

## 2023-07-06 ENCOUNTER — Encounter: Payer: Self-pay | Admitting: Nurse Practitioner

## 2023-07-08 ENCOUNTER — Other Ambulatory Visit: Payer: Self-pay

## 2023-07-08 DIAGNOSIS — Z170 Estrogen receptor positive status [ER+]: Secondary | ICD-10-CM

## 2023-07-08 MED ORDER — TAMOXIFEN CITRATE 20 MG PO TABS: 20.0000 mg | ORAL_TABLET | Freq: Every day | ORAL | 2 refills | Status: DC

## 2023-08-01 ENCOUNTER — Encounter (HOSPITAL_COMMUNITY): Payer: Self-pay

## 2023-08-06 ENCOUNTER — Other Ambulatory Visit: Payer: Self-pay | Admitting: Nurse Practitioner

## 2023-08-06 DIAGNOSIS — C50211 Malignant neoplasm of upper-inner quadrant of right female breast: Secondary | ICD-10-CM

## 2023-08-06 NOTE — Assessment & Plan Note (Signed)
 Stage IA, pT1bN0M0, ER+/PR+/HER2-, Grade I -Diagnosed 08/23/2020, s/p right breast lumpectomy by Dr. Cherlynn Cornfield 09/29/2020.  -Due to her age and early stage disease, sentinel lymph node biopsy was not performed and Oncotype was not recommended -Based on LUMINA trial data, her risk of local recurrence is likely less than 5% in the next 5 years, it was decided to forego adjuvant radiation -Given the strong ER/PR expression, moderate arthritis, and osteoporosis she proceeded with adjuvant tamoxifen  starting 10/2020, for 5-10 years -Mammogram 08/06/2022 was benign  - 3D screening mammogram scheduled for 08/08/2023. -Repeat DEXA scan due in 2025. -Continue tamoxifen  daily. -Labs with follow-up in 6 months, sooner if needed.

## 2023-08-06 NOTE — Progress Notes (Unsigned)
 Patient Care Team: Rachael Hurt, FNP as PCP - General (Family Medicine) Rachael Hsu, RN as Oncology Nurse Navigator Rachael Moore, Rachael Reveal, RN as Oncology Nurse Navigator Rachael Rima, MD as Consulting Physician (General Surgery) Rachael [place], MD as Consulting Physician (Hematology) Rachael Dawes, MD as Attending Physician (Radiation Oncology) Rachael Moore, Rachael K, NP as Nurse Practitioner (Nurse Practitioner) Rachael Moore as Physician Assistant (Dermatology)  Clinic Day:  08/07/2023  Referring physician: Alejandro Hurt, FNP  ASSESSMENT & PLAN:   Assessment & Plan: Malignant neoplasm of upper-inner quadrant of right breast in female, estrogen receptor positive (HCC) Stage IA, pT1bN0M0, ER+/PR+/HER2-, Grade I -Diagnosed 08/23/2020, s/p right breast lumpectomy by Dr. Cherlynn Moore 09/29/2020.  -Due to her age and early stage disease, sentinel lymph node biopsy was not performed and Oncotype was not recommended -Based on LUMINA trial data, her risk of local recurrence is likely less than 5% in the next 5 years, it was decided to forego adjuvant radiation -Given the strong ER/PR expression, moderate arthritis, and osteoporosis she proceeded with adjuvant tamoxifen  starting 10/2020, for 5-10 years -Mammogram 08/06/2022 was benign  - 3D screening mammogram scheduled for 08/08/2023. -Repeat DEXA scan due in 2025 ordered during today's visit.  -Continue tamoxifen  daily. -Labs with follow-up in 6 months, sooner if needed.   Plan:  Reviewed labs  -CBC unremarkable. CMP pending.  Discussed conservative measures to help with weight loss, such as increasing protein intake, gentle, light weight lifting, and adding core exercises, to help build strength. Also recommend she continue with light cardiovascular exercises like walking and biking.  Continue tamoxifen  20 mg daily. Started in 10/2020. She would like to stop at 5 year Rachael.  Mammogram scheduled for tomorrow, 08/08/2023.  Order bone density test  for later this year.  Labs and follow up in 6 months, sooner if needed.   The patient understands the plans discussed today and is in agreement with them.  She knows to contact our office if she develops concerns prior to her next appointment.  I provided 25 minutes of face-to-face time during this encounter and > 50% was spent counseling as documented under my assessment and plan.    Rachael Deis, NP  [facility] - [facility] DE[facility] [address] [place] [address] Dept: [phone] Dept Fax: [phone]   Orders Placed This Encounter  Procedures   DG Bone Density    Standing Status:   Future    Expected Date:   01/07/2024    Expiration Date:   08/06/2024    Reason for Exam (SYMPTOM  OR DIAGNOSIS REQUIRED):   estrogen deficit, on tamoxifen  for breast cancer. history of osteoporosis.    Preferred imaging location?:   [facility]      CHIEF COMPLAINT:  CC: Right breast cancer, estrogen receptor positive  Current Treatment: Tamoxifen  20 mg, started August 2022.  INTERVAL HISTORY:  Rachael Moore is here today for repeat clinical assessment.  Most recent visit was 02/27/2023 with Lacie, NP.  She is scheduled for 3D screening mammogram on 08/08/2023.  She Moore be due for new DEXA scan in 2025 also.  She does have osteoporosis.  Takes calcium and vitamin D.  Participates in regular, weightbearing exercise.  New DEXA scan ordered today. She states her only complaint is the weight gain she has experienced while on tamoxifen . She, otherwise, has no complaints. She denies chest pain, chest pressure, or shortness of breath.  She denies headaches or visual disturbances. She denies abdominal pain, nausea, vomiting, or changes in bowel or bladder habits.   She denies fevers or chills. She denies pain. Her appetite is good. Her weight has been stable.  I have reviewed the past medical history, past surgical history, social  history and family history with the patient and they are unchanged from previous note.  ALLERGIES:  is allergic to cetylpyridinium.  MEDICATIONS:  Current Outpatient Medications  Medication Sig Dispense Refill   acetaminophen  (TYLENOL ) 500 MG tablet Take 500-1,000 mg by mouth every 6 (six) hours as needed (pain).     Calcium Carb-Cholecalciferol (CALCIUM 500 + D3) 500-600 MG-UNIT TABS Take 1 tablet by mouth daily.     Coenzyme Q10 (COQ10 PO) Take 1 capsule by mouth daily at 12 noon. midday     levothyroxine (SYNTHROID ) 50 MCG tablet Take 50 mcg by mouth daily before breakfast.     Magnesium 250 MG TABS Take 1 tablet by mouth daily.     Multiple Vitamins-Minerals (IMMUNE SUPPORT PO) Take 1 tablet by mouth daily at 12 noon. midday     Multiple Vitamins-Minerals (MULTIVITAMIN ADULTS 50+) TABS Take 1 tablet by mouth daily.     pravastatin  (PRAVACHOL ) 40 MG tablet Take 40 mg by mouth at bedtime.     tamoxifen  (NOLVADEX ) 20 MG tablet Take 1 tablet (20 mg total) by mouth daily. 100 tablet 2   Fluorouracil  (TOLAK ) 4 % CREA Apply topical nightly for 14 days to face 40 g 0   No current facility-administered medications for this visit.    HISTORY OF PRESENT ILLNESS:   Oncology History Overview Note  Cancer Staging Malignant neoplasm of upper-inner quadrant of right breast in female, estrogen receptor positive (HCC) Staging form: Breast, AJCC 8th Edition - Clinical stage from 08/23/2020: Stage IA (cT1b, cN0, cM0, G1, ER+, PR+, HER2-) - Signed by Rachael Will, MD on 08/30/2020 Histologic grading system: 3 grade system - Pathologic stage from 09/29/2020: Stage Unknown (pT1b, pNX, cM0, G1, ER+, PR+, HER2-) - Signed by Rachael Silverton, MD on 10/15/2020 Stage prefix: Initial diagnosis Histologic grading system: 3 grade system Residual tumor (R): R0 - None    Malignant neoplasm of upper-inner quadrant of right breast in female, estrogen receptor positive (HCC)  08/15/2020 Mammogram   an irregular hypoechoic mass  in the RIGHT breast at the 2 o'clock axis, 6 cm from the nipple, measuring 8 x 5 mm, with internal vascularity, with associated architectural distortion, corresponding to the mammographic finding.   There is questionable intraductal extension peripheral to the mass, as manifested by mild ductal prominence up to 2 cm from the mass.   RIGHT axilla was evaluated with ultrasound showing no enlarged or morphologically abnormal lymph nodes   08/23/2020 Initial Biopsy   Diagnosis Breast, right, needle core biopsy, 2 o'clock - INVASIVE DUCTAL CARCINOMA - CALCIFICATIONS - SEE COMMENT  Microscopic Comment Based on the biopsy, the carcinoma appears Nottingham grade 1 of 3 and measures 0.4 cm in greatest linear extent. Prognostic markers (ER/PR/ki-67/HER2) are pending and Moore be reported in an addendum. Dr. Portia Brittle reviewed the case and agrees with the above diagnosis. These results were called to [facility] on August 24, 2020.    08/23/2020 Receptors her2   PROGNOSTIC INDICATORS Results: IMMUNOHISTOCHEMICAL AND MORPHOMETRIC ANALYSIS PERFORMED MANUALLY The tumor cells are EQUIVOCAL for Her2 (2+). Her2 by FISH Moore be performed and results reported separately. Estrogen Receptor: 100%, POSITIVE, STRONG STAINING INTENSITY Progesterone Receptor: 90%, POSITIVE, STRONG  STAINING INTENSITY Proliferation Marker Ki67: 1%   FLUORESCENCE IN-SITU HYBRIDIZATION Results: GROUP 5: HER2 **NEGATIVE** Equivocal form of amplification of the HER2 gene was detected in the IHC 2+ tissue sample received from this individual. HER2 FISH was performed by a technologist and cell imaging and analysis on the [employer].   08/23/2020 Cancer Staging   Staging form: Breast, AJCC 8th Edition - Clinical stage from 08/23/2020: Stage IA (cT1b, cN0, cM0, G1, ER+, PR+, HER2-) - Signed by Rachael [place], MD on 08/30/2020 Histologic grading system: 3 grade system   08/25/2020 Initial Diagnosis   Malignant neoplasm of  upper-inner quadrant of right breast in female, estrogen receptor positive (HCC)   09/29/2020 Cancer Staging   Staging form: Breast, AJCC 8th Edition - Pathologic stage from 09/29/2020: Stage Unknown (pT1b, pNX, cM0, G1, ER+, PR+, HER2-) - Signed by Rachael Parsons, MD on 10/15/2020 Stage prefix: Initial diagnosis Histologic grading system: 3 grade system Residual tumor (R): R0 - None   09/29/2020 Definitive Surgery   FINAL MICROSCOPIC DIAGNOSIS:   A. BREAST, RIGHT, LUMPECTOMY:  -  Invasive ductal carcinoma, Nottingham grade 1 of 3, 0.9 cm  -  Ductal carcinoma in-situ, low-grade  -  Margins uninvolved by carcinoma (0.6 cm; anterior margin)  -  Previous biopsy site changes present  -  See oncology table and comment below   B. BREAST, RIGHT ADDITIONAL POSTERIOR MARGIN, EXCISION:  -  Focal fibrocystic changes with apocrine metaplasia  -  No residual carcinoma identified        REVIEW OF SYSTEMS:   Constitutional: Denies fevers, chills or abnormal weight loss Eyes: Denies blurriness of vision Ears, nose, mouth, throat, and face: Denies mucositis or sore throat Respiratory: Denies cough, dyspnea or wheezes Cardiovascular: Denies palpitation, chest discomfort or lower extremity swelling Gastrointestinal:  Denies nausea, heartburn or change in bowel habits Skin: Denies abnormal skin rashes Lymphatics: Denies new lymphadenopathy or easy bruising Neurological:Denies numbness, tingling or new weaknesses Behavioral/Psych: Mood is stable, no new changes  All other systems were reviewed with the patient and are negative.   VITALS:   Today's Vitals   08/07/23 0837 08/07/23 0849  BP: 100/64   Pulse: 64   Resp: 17   Temp: 97.7 F (36.5 C)   SpO2: 95%   Weight: 176 lb 11.2 oz (80.2 kg)   PainSc:  0-No pain   Body mass index is 26.87 kg/m.   Wt Readings from Last 3 Encounters:  08/07/23 176 lb 11.2 oz (80.2 kg)  02/27/23 176 lb 3.2 oz (79.9 kg)  08/29/22 169 lb 1.6 oz (76.7 kg)    Body  mass index is 26.87 kg/m.  Performance status (ECOG): 1 - Symptomatic but completely ambulatory  PHYSICAL EXAM:   GENERAL:alert, no distress and comfortable SKIN: skin color, texture, turgor are normal, no rashes or significant lesions EYES: normal, Conjunctiva are pink and non-injected, sclera clear OROPHARYNX:no exudate, no erythema and lips, buccal mucosa, and tongue normal  NECK: supple, thyroid  normal size, non-tender, without nodularity LYMPH:  no palpable lymphadenopathy in the cervical, axillary or inguinal LUNGS: clear to auscultation and percussion with normal breathing effort HEART: regular rate & rhythm and no murmurs and no lower extremity edema ABDOMEN:abdomen soft, non-tender and normal bowel sounds Musculoskeletal:no cyanosis of digits and no clubbing  NEURO: alert & oriented x 3 with fluent speech, no focal motor/sensory deficits BREAST: there are no palpable masses or lumps in the left breast today. There is no nipple inversion or nipple discharge. There is no axillary  lymphadenopathy on the left. There is very well healed, small surgical scar on the right breast, adjacent to the nipple. There is no nipple inversion or nipple discharge. There are no palpable masses or lumps in the right breast. There is no axillary lymphadenopathy on the right.   LABORATORY DATA:  I have reviewed the data as listed    Component Value Date/Time   NA 141 02/27/2023 0842   Moore 4.1 02/27/2023 0842   CL 109 02/27/2023 0842   CO2 28 02/27/2023 0842   GLUCOSE 89 02/27/2023 0842   BUN 13 02/27/2023 0842   CREATININE 0.81 02/27/2023 0842   CALCIUM 9.2 02/27/2023 0842   PROT 6.6 02/27/2023 0842   ALBUMIN 4.0 02/27/2023 0842   AST 24 02/27/2023 0842   ALT 8 02/27/2023 0842   ALKPHOS 31 (L) 02/27/2023 0842   BILITOT 0.5 02/27/2023 0842   GFRNONAA >60 02/27/2023 0842    Lab Results  Component Value Date   WBC 9.7 08/07/2023   NEUTROABS 5.0 08/07/2023   HGB 14.3 08/07/2023   HCT 41.9  08/07/2023   MCV 95.9 08/07/2023   PLT 292 08/07/2023

## 2023-08-07 ENCOUNTER — Inpatient Hospital Stay: Payer: Medicare Other

## 2023-08-07 ENCOUNTER — Encounter: Payer: Self-pay | Admitting: Nurse Practitioner

## 2023-08-07 ENCOUNTER — Inpatient Hospital Stay: Payer: Medicare Other | Attending: Nurse Practitioner | Admitting: Nurse Practitioner

## 2023-08-07 VITALS — BP 100/64 | HR 64 | Temp 97.7°F | Resp 17 | Wt 176.7 lb

## 2023-08-07 DIAGNOSIS — Z170 Estrogen receptor positive status [ER+]: Secondary | ICD-10-CM | POA: Diagnosis not present

## 2023-08-07 DIAGNOSIS — C50211 Malignant neoplasm of upper-inner quadrant of right female breast: Secondary | ICD-10-CM | POA: Diagnosis not present

## 2023-08-07 DIAGNOSIS — Z79810 Long term (current) use of selective estrogen receptor modulators (SERMs): Secondary | ICD-10-CM | POA: Insufficient documentation

## 2023-08-07 LAB — CBC WITH DIFFERENTIAL (CANCER CENTER ONLY)
Abs Immature Granulocytes: 0.01 10*3/uL (ref 0.00–0.07)
Basophils Absolute: 0.1 10*3/uL (ref 0.0–0.1)
Basophils Relative: 1 %
Eosinophils Absolute: 0.2 10*3/uL (ref 0.0–0.5)
Eosinophils Relative: 2 %
HCT: 41.9 % (ref 36.0–46.0)
Hemoglobin: 14.3 g/dL (ref 12.0–15.0)
Immature Granulocytes: 0 %
Lymphocytes Relative: 37 %
Lymphs Abs: 3.6 10*3/uL (ref 0.7–4.0)
MCH: 32.7 pg (ref 26.0–34.0)
MCHC: 34.1 g/dL (ref 30.0–36.0)
MCV: 95.9 fL (ref 80.0–100.0)
Monocytes Absolute: 0.9 10*3/uL (ref 0.1–1.0)
Monocytes Relative: 9 %
Neutro Abs: 5 10*3/uL (ref 1.7–7.7)
Neutrophils Relative %: 51 %
Platelet Count: 292 10*3/uL (ref 150–400)
RBC: 4.37 MIL/uL (ref 3.87–5.11)
RDW: 12.3 % (ref 11.5–15.5)
WBC Count: 9.7 10*3/uL (ref 4.0–10.5)
nRBC: 0 % (ref 0.0–0.2)

## 2023-08-07 LAB — CMP (CANCER CENTER ONLY)
ALT: 9 U/L (ref 0–44)
AST: 24 U/L (ref 15–41)
Albumin: 4.3 g/dL (ref 3.5–5.0)
Alkaline Phosphatase: 32 U/L — ABNORMAL LOW (ref 38–126)
Anion gap: 6 (ref 5–15)
BUN: 15 mg/dL (ref 8–23)
CO2: 29 mmol/L (ref 22–32)
Calcium: 9.4 mg/dL (ref 8.9–10.3)
Chloride: 106 mmol/L (ref 98–111)
Creatinine: 0.95 mg/dL (ref 0.44–1.00)
GFR, Estimated: >60 mL/min (ref >60)
Glucose, Bld: 91 mg/dL (ref 70–99)
Potassium: 4.4 mmol/L (ref 3.5–5.1)
Sodium: 141 mmol/L (ref 135–145)
Total Bilirubin: 0.4 mg/dL (ref 0.0–1.2)
Total Protein: 7.1 g/dL (ref 6.5–8.1)

## 2023-08-08 ENCOUNTER — Ambulatory Visit
Admission: RE | Admit: 2023-08-08 | Discharge: 2023-08-08 | Disposition: A | Discharge status: Home / Self Care | Payer: Medicare Other | Source: Ambulatory Visit | Attending: Nurse Practitioner | Admitting: Nurse Practitioner

## 2023-08-08 DIAGNOSIS — Z1231 Encounter for screening mammogram for malignant neoplasm of breast: Secondary | ICD-10-CM | POA: Diagnosis not present

## 2023-08-08 DIAGNOSIS — C50211 Malignant neoplasm of upper-inner quadrant of right female breast: Secondary | ICD-10-CM

## 2023-08-12 ENCOUNTER — Encounter: Payer: Self-pay | Admitting: Nurse Practitioner

## 2023-12-11 ENCOUNTER — Ambulatory Visit

## 2023-12-11 DIAGNOSIS — Z780 Asymptomatic menopausal state: Secondary | ICD-10-CM | POA: Diagnosis not present

## 2023-12-11 DIAGNOSIS — Z170 Estrogen receptor positive status [ER+]: Secondary | ICD-10-CM

## 2023-12-11 DIAGNOSIS — M810 Age-related osteoporosis without current pathological fracture: Secondary | ICD-10-CM | POA: Diagnosis not present

## 2023-12-11 DIAGNOSIS — C50211 Malignant neoplasm of upper-inner quadrant of right female breast: Secondary | ICD-10-CM | POA: Diagnosis not present

## 2024-01-08 DIAGNOSIS — D229 Melanocytic nevi, unspecified: Secondary | ICD-10-CM | POA: Diagnosis not present

## 2024-01-08 DIAGNOSIS — L821 Other seborrheic keratosis: Secondary | ICD-10-CM | POA: Diagnosis not present

## 2024-01-08 DIAGNOSIS — L814 Other melanin hyperpigmentation: Secondary | ICD-10-CM | POA: Diagnosis not present

## 2024-01-08 DIAGNOSIS — Z86007 Personal history of in-situ neoplasm of skin: Secondary | ICD-10-CM | POA: Diagnosis not present

## 2024-01-08 DIAGNOSIS — L578 Other skin changes due to chronic exposure to nonionizing radiation: Secondary | ICD-10-CM | POA: Diagnosis not present

## 2024-01-31 ENCOUNTER — Telehealth: Payer: Self-pay | Admitting: Nurse Practitioner

## 2024-01-31 NOTE — Telephone Encounter (Signed)
 Called the patient to inform them that their provider change .

## 2024-02-04 ENCOUNTER — Other Ambulatory Visit: Payer: Self-pay | Admitting: Nurse Practitioner

## 2024-02-04 DIAGNOSIS — Z170 Estrogen receptor positive status [ER+]: Secondary | ICD-10-CM

## 2024-02-04 NOTE — Assessment & Plan Note (Signed)
 Stage IA, pT1bN0M0, ER+/PR+/HER2-, Grade I -Diagnosed 08/23/2020, s/p right breast lumpectomy by Dr. Aron 09/29/2020.  -Due to her age and early stage disease, sentinel lymph node biopsy was not performed and Oncotype was not recommended -Based on LUMINA trial data, her risk of local recurrence is likely less than 5% in the next 5 years, it was decided to forego adjuvant radiation -Given the strong ER/PR expression, moderate arthritis, and osteoporosis she proceeded with adjuvant tamoxifen  starting 10/2020, for 5-10 years -Mammogram 08/06/2022 was benign  - 3D screening mammogram from 08/08/2023 was benign. - DEXA scan from 12/11/2023 showed osteoporosis. -continue tamoxifen  daily -plan for labs and follow up in 6 months, sooner if needed

## 2024-02-04 NOTE — Progress Notes (Unsigned)
 Patient Care Team: Marvene Prentice SAUNDERS, FNP as PCP - General (Family Medicine) Tyree Nanetta SAILOR, RN as Oncology Nurse Navigator Aron Shoulders, MD as Consulting Physician (General Surgery) Lanny Callander, MD as Consulting Physician (Hematology) Izell Domino, MD as Attending Physician (Radiation Oncology) Burton, Lacie K, NP as Nurse Practitioner (Nurse Practitioner) Porter Andrez SAUNDERS, PA-C (Inactive) as Physician Assistant (Dermatology)  Clinic Day:  02/05/2024  Referring physician: Marvene Prentice SAUNDERS, FNP  ASSESSMENT & PLAN:   Assessment & Plan: Malignant neoplasm of upper-inner quadrant of right breast in female, estrogen receptor positive (HCC) Stage IA, pT1bN0M0, ER+/PR+/HER2-, Grade I -Diagnosed 08/23/2020, s/p right breast lumpectomy by Dr. Aron 09/29/2020.  -Due to her age and early stage disease, sentinel lymph node biopsy was not performed and Oncotype was not recommended -Based on LUMINA trial data, her risk of local recurrence is likely less than 5% in the next 5 years, it was decided to forego adjuvant radiation -Given the strong ER/PR expression, moderate arthritis, and osteoporosis she proceeded with adjuvant tamoxifen  starting 10/2020, for 5-10 years -Mammogram 08/06/2022 was benign  - 3D screening mammogram from 08/08/2023 was benign. - DEXA scan from 12/11/2023 showed osteoporosis. -continue tamoxifen  daily -plan for labs and follow up in 6 months, sooner if needed     Osteoporosis Bone density from 11/2023 showing osteoporosis. Somewhat improved from prior scan in 2023. She does take calcium/D3 500/600 mg supplement. Recommended she add Vitamin D3 2000 international units daily. Conitnue tamoxifen  daily. She should continue low-impact exercise and minimize fall risk. Repeat bone density in 2027.   Right breast cancer Breast exam benign today. Mammogram from 07/2023 benign and reviewed with the patient. New 3D screening mammogram ordered for 07/2024. Continue tamoxifen  daily.  Biannual follow up.   Plan. Labs reviewed.  -CBC and CMP unremarkable.  Reviewed mammogram from 08/08/2023 which was benign.  Reviewed bone density from 11/2023 showing improved osteoporosis in general.  Continue tamoxifen  20 mg daily.  Screening mammogram due 07/2024.  Labs and follow up in 6 months, sooner if needed.   The patient understands the plans discussed today and is in agreement with them.  She knows to contact our office if she develops concerns prior to her next appointment.  I provided 25 minutes of face-to-face time during this encounter and > 50% was spent counseling as documented under my assessment and plan.    Powell FORBES Lessen, NP  [facility] - A DEPT OF [facility] [address] [place] [address] Dept: [phone] Dept Fax: [phone]   Orders Placed This Encounter  Procedures   MM 3D SCREENING MAMMOGRAM BILATERAL BREAST    Standing Status:   Future    Expected Date:   08/07/2024    Expiration Date:   02/04/2025    Reason for Exam (SYMPTOM  OR DIAGNOSIS REQUIRED):   screening for breast cancer    Preferred imaging location?:   [facility]      CHIEF COMPLAINT:  CC: Right breast cancer, ER +  Current Treatment: Tamoxifen  20 mg (started 10/2020)  INTERVAL HISTORY:  Asja is here today for repeat clinical assessment.  She last saw me on 08/07/2023.  She had 3D screening mammogram on 08/08/2023.  She has breast density category B.  Overall results were benign.  DEXA scan was done 12/11/2023.  She does have osteoporosis based on BMD.  She currently takes calcium and vitamin D every day.  Previous bone density from  04/24/2021 was also positive for osteoporosis.  She currently takes calcium/vitamin D3 supplement daily 500/600 mg daily. Recommended adding additional vitamin d3 daily. She is active, participating in low-impact exercises. She is on tamoxifen  daily which is helping improve bone  strength. She is tolerating this well, overall. Has noted weight gain, brittle nails, and hot flashes. She denies changes or new lumps/masses in either breast. She denies chest pain, chest pressure, or shortness of breath. She denies headaches or visual disturbances. She denies abdominal pain, nausea, vomiting, or changes in bowel or bladder habits.  She denies fevers or chills. She denies pain. Her appetite is good. Her weight has increased 3 pounds over last 6 months.  I have reviewed the past medical history, past surgical history, social history and family history with the patient and they are unchanged from previous note.  ALLERGIES:  is allergic to cetylpyridinium.  MEDICATIONS:  Current Outpatient Medications  Medication Sig Dispense Refill   acetaminophen  (TYLENOL ) 500 MG tablet Take 500-1,000 mg by mouth every 6 (six) hours as needed (pain).     Calcium Carb-Cholecalciferol (CALCIUM 500 + D3) 500-600 MG-UNIT TABS Take 1 tablet by mouth daily.     Coenzyme Q10 (COQ10 PO) Take 1 capsule by mouth daily at 12 noon. midday     levothyroxine (SYNTHROID ) 50 MCG tablet Take 50 mcg by mouth daily before breakfast.     Magnesium 250 MG TABS Take 1 tablet by mouth daily.     Multiple Vitamins-Minerals (IMMUNE SUPPORT PO) Take 1 tablet by mouth daily at 12 noon. midday     Multiple Vitamins-Minerals (MULTIVITAMIN ADULTS 50+) TABS Take 1 tablet by mouth daily.     pravastatin  (PRAVACHOL ) 40 MG tablet Take 40 mg by mouth at bedtime.     tamoxifen  (NOLVADEX ) 20 MG tablet Take 1 tablet (20 mg total) by mouth daily. 100 tablet 2   No current facility-administered medications for this visit.    HISTORY OF PRESENT ILLNESS:   Oncology History Overview Note  Cancer Staging Malignant neoplasm of upper-inner quadrant of right breast in female, estrogen receptor positive (HCC) Staging form: Breast, AJCC 8th Edition - Clinical stage from 08/23/2020: Stage IA (cT1b, cN0, cM0, G1, ER+, PR+, HER2-) - Signed  by Lanny Callander, MD on 08/30/2020 Histologic grading system: 3 grade system - Pathologic stage from 09/29/2020: Stage Unknown (pT1b, pNX, cM0, G1, ER+, PR+, HER2-) - Signed by Lanny Callander, MD on 10/15/2020 Stage prefix: Initial diagnosis Histologic grading system: 3 grade system Residual tumor (R): R0 - None    Malignant neoplasm of upper-inner quadrant of right breast in female, estrogen receptor positive (HCC)  08/15/2020 Mammogram   an irregular hypoechoic mass in the RIGHT breast at the 2 o'clock axis, 6 cm from the nipple, measuring 8 x 5 mm, with internal vascularity, with associated architectural distortion, corresponding to the mammographic finding.   There is questionable intraductal extension peripheral to the mass, as manifested by mild ductal prominence up to 2 cm from the mass.   RIGHT axilla was evaluated with ultrasound showing no enlarged or morphologically abnormal lymph nodes   08/23/2020 Initial Biopsy   Diagnosis Breast, right, needle core biopsy, 2 o'clock - INVASIVE DUCTAL CARCINOMA - CALCIFICATIONS - SEE COMMENT  Microscopic Comment Based on the biopsy, the carcinoma appears Nottingham grade 1 of 3 and measures 0.4 cm in greatest linear extent. Prognostic markers (ER/PR/ki-67/HER2) are pending and will be reported in an addendum. Dr. Belvie reviewed the case and agrees with the above diagnosis.  These results were called to [facility] on August 24, 2020.    08/23/2020 Receptors her2   PROGNOSTIC INDICATORS Results: IMMUNOHISTOCHEMICAL AND MORPHOMETRIC ANALYSIS PERFORMED MANUALLY The tumor cells are EQUIVOCAL for Her2 (2+). Her2 by FISH will be performed and results reported separately. Estrogen Receptor: 100%, POSITIVE, STRONG STAINING INTENSITY Progesterone Receptor: 90%, POSITIVE, STRONG STAINING INTENSITY Proliferation Marker Ki67: 1%   FLUORESCENCE IN-SITU HYBRIDIZATION Results: GROUP 5: HER2 **NEGATIVE** Equivocal form of amplification  of the HER2 gene was detected in the IHC 2+ tissue sample received from this individual. HER2 FISH was performed by a technologist and cell imaging and analysis on the [employer].   08/23/2020 Cancer Staging   Staging form: Breast, AJCC 8th Edition - Clinical stage from 08/23/2020: Stage IA (cT1b, cN0, cM0, G1, ER+, PR+, HER2-) - Signed by Lanny Callander, MD on 08/30/2020 Histologic grading system: 3 grade system   08/25/2020 Initial Diagnosis   Malignant neoplasm of upper-inner quadrant of right breast in female, estrogen receptor positive (HCC)   09/29/2020 Cancer Staging   Staging form: Breast, AJCC 8th Edition - Pathologic stage from 09/29/2020: Stage Unknown (pT1b, pNX, cM0, G1, ER+, PR+, HER2-) - Signed by Lanny Callander, MD on 10/15/2020 Stage prefix: Initial diagnosis Histologic grading system: 3 grade system Residual tumor (R): R0 - None   09/29/2020 Definitive Surgery   FINAL MICROSCOPIC DIAGNOSIS:   A. BREAST, RIGHT, LUMPECTOMY:  -  Invasive ductal carcinoma, Nottingham grade 1 of 3, 0.9 cm  -  Ductal carcinoma in-situ, low-grade  -  Margins uninvolved by carcinoma (0.6 cm; anterior margin)  -  Previous biopsy site changes present  -  See oncology table and comment below   B. BREAST, RIGHT ADDITIONAL POSTERIOR MARGIN, EXCISION:  -  Focal fibrocystic changes with apocrine metaplasia  -  No residual carcinoma identified        REVIEW OF SYSTEMS:   Constitutional: Denies fevers, chills or abnormal weight loss Eyes: Denies blurriness of vision Ears, nose, mouth, throat, and face: Denies mucositis or sore throat Respiratory: Denies cough, dyspnea or wheezes Cardiovascular: Denies palpitation, chest discomfort or lower extremity swelling Gastrointestinal:  Denies nausea, heartburn or change in bowel habits Skin: Denies abnormal skin rashes Lymphatics: Denies new lymphadenopathy or easy bruising Neurological:Denies numbness, tingling or new weaknesses Behavioral/Psych: Mood is stable, no new  changes  All other systems were reviewed with the patient and are negative.   VITALS:   Today's Vitals   02/05/24 0945  BP: 124/69  Pulse: 68  Resp: 17  Temp: 98.3 F (36.8 C)  SpO2: 96%  Weight: 179 lb 1.6 oz (81.2 kg)  Height: 5' 8 (1.727 m)   Body mass index is 27.23 kg/m.   Wt Readings from Last 3 Encounters:  02/05/24 179 lb 1.6 oz (81.2 kg)  08/07/23 176 lb 11.2 oz (80.2 kg)  02/27/23 176 lb 3.2 oz (79.9 kg)    Body mass index is 27.23 kg/m.  Performance status (ECOG): 1 - Symptomatic but completely ambulatory  PHYSICAL EXAM:   GENERAL:alert, no distress and comfortable SKIN: skin color, texture, turgor are normal, no rashes or significant lesions EYES: normal, Conjunctiva are pink and non-injected, sclera clear OROPHARYNX:no exudate, no erythema and lips, buccal mucosa, and tongue normal  NECK: supple, thyroid  normal size, non-tender, without nodularity LYMPH:  no palpable lymphadenopathy in the cervical, axillary or inguinal LUNGS: clear to auscultation and percussion with normal breathing effort HEART: regular rate & rhythm and no murmurs and no lower extremity  edema ABDOMEN:abdomen soft, non-tender and normal bowel sounds Musculoskeletal:no cyanosis of digits and no clubbing  NEURO: alert & oriented x 3 with fluent speech, no focal motor/sensory deficits BREAST: there are no palpable lumps or masses in the left breast. There is in nipple inversion or nipple discharge. There is no axillary lymphadenopathy on the left. The right breast has well healed lumpectomy scar. There are no palpable lumps or masses noted today. There is no nipple inversion or nipple discharge. There is no axillary lymphadenopathy on the right.   LABORATORY DATA:  I have reviewed the data as listed    Component Value Date/Time   NA 140 02/05/2024 0910   K 3.9 02/05/2024 0910   CL 107 02/05/2024 0910   CO2 26 02/05/2024 0910   GLUCOSE 94 02/05/2024 0910   BUN 18 02/05/2024 0910    CREATININE 1.01 (H) 02/05/2024 0910   CALCIUM 9.2 02/05/2024 0910   PROT 6.9 02/05/2024 0910   ALBUMIN 4.1 02/05/2024 0910   AST 23 02/05/2024 0910   ALT 8 02/05/2024 0910   ALKPHOS 32 (L) 02/05/2024 0910   BILITOT 0.4 02/05/2024 0910   GFRNONAA 58 (L) 02/05/2024 0910     Lab Results  Component Value Date   WBC 7.2 02/05/2024   NEUTROABS 3.4 02/05/2024   HGB 14.0 02/05/2024   HCT 42.1 02/05/2024   MCV 96.3 02/05/2024   PLT 314 02/05/2024

## 2024-02-05 ENCOUNTER — Inpatient Hospital Stay: Attending: Nurse Practitioner

## 2024-02-05 ENCOUNTER — Encounter: Payer: Self-pay | Admitting: Nurse Practitioner

## 2024-02-05 ENCOUNTER — Inpatient Hospital Stay: Admitting: Nurse Practitioner

## 2024-02-05 VITALS — BP 124/69 | HR 68 | Temp 98.3°F | Resp 17 | Ht 68.0 in | Wt 179.1 lb

## 2024-02-05 DIAGNOSIS — Z170 Estrogen receptor positive status [ER+]: Secondary | ICD-10-CM

## 2024-02-05 DIAGNOSIS — C50211 Malignant neoplasm of upper-inner quadrant of right female breast: Secondary | ICD-10-CM | POA: Diagnosis present

## 2024-02-05 DIAGNOSIS — Z17411 Hormone receptor positive with human epidermal growth factor receptor 2 negative status: Secondary | ICD-10-CM | POA: Diagnosis not present

## 2024-02-05 DIAGNOSIS — M810 Age-related osteoporosis without current pathological fracture: Secondary | ICD-10-CM | POA: Diagnosis not present

## 2024-02-05 LAB — CMP (CANCER CENTER ONLY)
ALT: 8 U/L (ref 0–44)
AST: 23 U/L (ref 15–41)
Albumin: 4.1 g/dL (ref 3.5–5.0)
Alkaline Phosphatase: 32 U/L — ABNORMAL LOW (ref 38–126)
Anion gap: 7 (ref 5–15)
BUN: 18 mg/dL (ref 8–23)
CO2: 26 mmol/L (ref 22–32)
Calcium: 9.2 mg/dL (ref 8.9–10.3)
Chloride: 107 mmol/L (ref 98–111)
Creatinine: 1.01 mg/dL — ABNORMAL HIGH (ref 0.44–1.00)
GFR, Estimated: 58 mL/min — ABNORMAL LOW (ref >60)
Glucose, Bld: 94 mg/dL (ref 70–99)
Potassium: 3.9 mmol/L (ref 3.5–5.1)
Sodium: 140 mmol/L (ref 135–145)
Total Bilirubin: 0.4 mg/dL (ref 0.0–1.2)
Total Protein: 6.9 g/dL (ref 6.5–8.1)

## 2024-02-05 LAB — CBC WITH DIFFERENTIAL (CANCER CENTER ONLY)
Abs Immature Granulocytes: 0.02 K/uL (ref 0.00–0.07)
Basophils Absolute: 0.1 K/uL (ref 0.0–0.1)
Basophils Relative: 1 %
Eosinophils Absolute: 0.1 K/uL (ref 0.0–0.5)
Eosinophils Relative: 2 %
HCT: 42.1 % (ref 36.0–46.0)
Hemoglobin: 14 g/dL (ref 12.0–15.0)
Immature Granulocytes: 0 %
Lymphocytes Relative: 42 %
Lymphs Abs: 3 K/uL (ref 0.7–4.0)
MCH: 32 pg (ref 26.0–34.0)
MCHC: 33.3 g/dL (ref 30.0–36.0)
MCV: 96.3 fL (ref 80.0–100.0)
Monocytes Absolute: 0.6 K/uL (ref 0.1–1.0)
Monocytes Relative: 9 %
Neutro Abs: 3.4 K/uL (ref 1.7–7.7)
Neutrophils Relative %: 46 %
Platelet Count: 314 K/uL (ref 150–400)
RBC: 4.37 MIL/uL (ref 3.87–5.11)
RDW: 12.7 % (ref 11.5–15.5)
WBC Count: 7.2 K/uL (ref 4.0–10.5)
nRBC: 0 % (ref 0.0–0.2)

## 2024-02-07 ENCOUNTER — Ambulatory Visit: Admitting: Nurse Practitioner

## 2024-02-07 ENCOUNTER — Other Ambulatory Visit

## 2024-03-17 ENCOUNTER — Encounter: Payer: Self-pay | Admitting: Nurse Practitioner

## 2024-03-17 ENCOUNTER — Other Ambulatory Visit (HOSPITAL_COMMUNITY): Payer: Self-pay

## 2024-03-20 ENCOUNTER — Other Ambulatory Visit (HOSPITAL_COMMUNITY): Payer: Self-pay

## 2024-03-20 ENCOUNTER — Other Ambulatory Visit: Payer: Self-pay | Admitting: Nurse Practitioner

## 2024-03-20 DIAGNOSIS — C50211 Malignant neoplasm of upper-inner quadrant of right female breast: Secondary | ICD-10-CM

## 2024-03-20 MED ORDER — TAMOXIFEN CITRATE 20 MG PO TABS
20.0000 mg | ORAL_TABLET | Freq: Every day | ORAL | 2 refills | Status: AC
  Filled 2024-03-20 – 2024-04-03 (×2): qty 100, 100d supply, fill #0

## 2024-03-23 ENCOUNTER — Other Ambulatory Visit (HOSPITAL_COMMUNITY): Payer: Self-pay

## 2024-03-23 ENCOUNTER — Other Ambulatory Visit: Payer: Self-pay

## 2024-03-23 MED ORDER — LEVOTHYROXINE SODIUM 50 MCG PO TABS
50.0000 ug | ORAL_TABLET | Freq: Every morning | ORAL | 0 refills | Status: AC
  Filled 2024-03-23: qty 90, 90d supply, fill #0

## 2024-03-23 MED ORDER — PRAVASTATIN SODIUM 40 MG PO TABS
40.0000 mg | ORAL_TABLET | Freq: Every day | ORAL | 0 refills | Status: AC
  Filled 2024-03-23 – 2024-04-03 (×2): qty 90, 90d supply, fill #0

## 2024-03-24 ENCOUNTER — Other Ambulatory Visit (HOSPITAL_COMMUNITY): Payer: Self-pay

## 2024-04-02 ENCOUNTER — Other Ambulatory Visit (HOSPITAL_COMMUNITY): Payer: Self-pay

## 2024-04-03 ENCOUNTER — Other Ambulatory Visit (HOSPITAL_COMMUNITY): Payer: Self-pay

## 2024-08-10 ENCOUNTER — Inpatient Hospital Stay: Admitting: Nurse Practitioner

## 2024-08-10 ENCOUNTER — Inpatient Hospital Stay

## 2024-08-12 ENCOUNTER — Ambulatory Visit
